# Patient Record
Sex: Male | Born: 1990 | Race: White | Hispanic: No | Marital: Single | Smoking: Current every day smoker
Health system: Southern US, Community
[De-identification: ages and names within clinical notes are randomized; demographics above are authoritative.]

## PROBLEM LIST (undated history)

## (undated) ENCOUNTER — Emergency Department (HOSPITAL_COMMUNITY): Admission: EM | Payer: Medicaid Other

## (undated) HISTORY — PX: OTHER SURGICAL HISTORY: SHX169

## (undated) HISTORY — PX: LEG SURGERY: SHX1003

---
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
## 2010-11-24 LAB — CBC
HCT: 27 % — ABNORMAL LOW (ref 39.0–52.0)
HCT: 39.9 % (ref 39.0–52.0)
HCT: 40.7 % (ref 39.0–52.0)
Hemoglobin: 13.4 g/dL (ref 13.0–17.0)
Hemoglobin: 13.9 g/dL (ref 13.0–17.0)
Hemoglobin: 9.4 g/dL — ABNORMAL LOW (ref 13.0–17.0)
MCHC: 34.1 g/dL (ref 30.0–36.0)
MCHC: 34.1 g/dL (ref 30.0–36.0)
MCV: 100 fL (ref 78.0–100.0)
MCV: 100.3 fL — ABNORMAL HIGH (ref 78.0–100.0)
Platelets: 204 10*3/uL (ref 150–400)
Platelets: 216 10*3/uL (ref 150–400)
Platelets: 244 10*3/uL (ref 150–400)
RBC: 2.73 MIL/uL — ABNORMAL LOW (ref 4.22–5.81)
RBC: 4.01 MIL/uL — ABNORMAL LOW (ref 4.22–5.81)
RDW: 13.1 % (ref 11.5–15.5)
RDW: 13.2 % (ref 11.5–15.5)
RDW: 13.3 % (ref 11.5–15.5)
WBC: 5.4 10*3/uL (ref 4.0–10.5)

## 2010-11-24 LAB — BASIC METABOLIC PANEL
BUN: 10 mg/dL (ref 6–23)
BUN: 11 mg/dL (ref 6–23)
BUN: 14 mg/dL (ref 6–23)
CO2: 28 mEq/L (ref 19–32)
Calcium: 8.9 mg/dL (ref 8.4–10.5)
Calcium: 9.1 mg/dL (ref 8.4–10.5)
Creatinine, Ser: 1.23 mg/dL (ref 0.4–1.5)
GFR calc non Af Amer: >60 mL/min (ref >60)
GFR calc non Af Amer: >60 mL/min (ref >60)
GFR calc non Af Amer: >60 mL/min (ref >60)
Glucose, Bld: 90 mg/dL (ref 70–99)
Glucose, Bld: 98 mg/dL (ref 70–99)
Potassium: 4.5 mEq/L (ref 3.5–5.1)
Potassium: 5.1 mEq/L (ref 3.5–5.1)
Sodium: 135 mEq/L (ref 135–145)
Sodium: 138 mEq/L (ref 135–145)

## 2010-11-24 LAB — COMPREHENSIVE METABOLIC PANEL
AST: 21 U/L (ref 0–37)
CO2: 28 mEq/L (ref 19–32)
Calcium: 9 mg/dL (ref 8.4–10.5)
Creatinine, Ser: 1.29 mg/dL (ref 0.4–1.5)
GFR calc Af Amer: >60 mL/min (ref >60)
GFR calc non Af Amer: >60 mL/min (ref >60)

## 2010-11-24 LAB — CULTURE, ROUTINE-ABSCESS

## 2010-11-24 LAB — ANAEROBIC CULTURE

## 2010-11-24 LAB — PROTIME-INR
INR: 1.1 (ref 0.00–1.49)
Prothrombin Time: 13.9 seconds (ref 11.6–15.2)

## 2010-11-24 LAB — C-REACTIVE PROTEIN: CRP: 2.7 mg/dL — ABNORMAL HIGH (ref <0.6)

## 2010-12-31 NOTE — Op Note (Signed)
NAMELAURENT, CARGILE          ACCOUNT NO.:  [id number]   MEDICAL RECORD NO.:  [id number]          PATIENT TYPE:  INP   LOCATION:  5033                         FACILITY:  [facility]   PHYSICIAN:  Feliberto Gottron. Turner Daniels, M.D.   DATE OF BIRTH:  Aug 07, 1991   DATE OF PROCEDURE:  03/27/2009  DATE OF DISCHARGE:                               OPERATIVE REPORT   PREOPERATIVE DIAGNOSIS:  Right forearm abscess.   POSTOPERATIVE DIAGNOSIS:  Right forearm abscess.   PROCEDURE:  Incision and drainage, right forearm abscess, subcutaneous.   SURGEON:  Feliberto Gottron. Turner Daniels, MD   FIRST ASSISTANT:  None.   ANESTHETIC:  General, LMA.   ESTIMATED BLOOD LOSS:  Minimal.   FLUID REPLACEMENT:  500 mL of crystalloid.   DRAINS PLACED:  Iodoform wick.   SPECIMENS TO LAB:  Culture and gram stain of the abscess fluid.   INDICATIONS FOR PROCEDURE:  An 20 year old young man presented to our  office yesterday 4 days after being bitten by a spider he thinks, having  failed treatment with oral doxycycline and Septra.  We admitted him to  the hospital because of rapidly progressing erythema and swelling going  down his arm.  An MRI scan showed about a 2-cm abscess overnight, on  Zosyn and vancomycin the erythema diminish greatly, but he still had a  painful abscess on his forearm.  He is taken for irrigation and  debridement of abscess to decrease pain and increase function.  We got a  good specimen of the organism that we are dealing with.  The risks and  benefits of surgery have been discussed.  Questions are answered.   DESCRIPTION OF PROCEDURE:  The patient was identified by armband and  taken to the operating room #10.  Appropriate anesthetic monitors were  attached and general LMA anesthesia was induced with the patient in the  supine position.  Right upper extremity was prepped and draped in the  usual sterile fashion from the wrist to just above the elbow.  A time-  out procedure was performed.  We began the  operation itself by making a  0.5-cm incision over the abscess cutting into the abscess cavity and  getting back green purulent material about 3-5 mL.  Gram stain and  cultures were sent.  The abscess cavity was then thoroughly irrigated  with normal saline alternating with  hydrogen peroxide.  The cavity itself was about 2 cm x 2 cm x 1 cm and  relatively easy to clean.  We then placed a 0.25-inch iodoform wick,  placed 2 simple sutures on either end of the wound and a dressing of 4 x  4's and an Ace wrap.  At this point, the patient was awakened and taken  to the recovery room without difficulty.      Feliberto Gottron. Turner Daniels, M.D.  Electronically Signed     FJR/MEDQ  D:  03/28/2009  T:  03/28/2009  Job:  [id number]

## 2011-01-03 NOTE — Discharge Summary (Signed)
NAMENASSER, KU          ACCOUNT NO.:  [id number]   MEDICAL RECORD NO.:  [id number]          PATIENT TYPE:  INP   LOCATION:  5033                         FACILITY:  [facility]   PHYSICIAN:  Feliberto Gottron. Turner Daniels, M.D.   DATE OF BIRTH:  1990/10/27   DATE OF ADMISSION:  03/27/2009  DATE OF DISCHARGE:  03/31/2009                               DISCHARGE SUMMARY   CHIEF COMPLAINT:  Right elbow pain.   HISTORY OF PRESENT ILLNESS:  This is an 20 year old gentleman who  presented to the clinic on March 27, 2009 after a possible spider bite.  He was seen at Urgent Care and was treated with doxycycline and Septra.  He came in complaining of rapidly progressing erythema and swelling in  his elbow and forearm and so he was admitted to the hospital for IV  antibiotic therapy.  Infectious Disease was consulted and the patient  was placed on vancomycin and Zosyn.  The following morning, he reported  slight improvement in his pain.  The erythema and swelling in his elbow  appeared to be improving.  An MRI scan demonstrated evidence of an  abscess and he was taken to the operating room for incision and drainage  that afternoon.  On the first postoperative day, the patient was awake  and alert.  He complained of pain in his elbow that was fairly well  controlled with oral medications.  He was afebrile.  His surgical  dressing was changed and his incision was benign.  On the second  postoperative day, the patient was doing well and reported significant  improvement in his elbow pain.  The sensitivities came back and revealed  MRSA, so a PICC line was placed.  The patient was discharged home the  following day on IV vancomycin therapy.   DISPOSITION:  The patient was discharged home on March 31, 2009.  Genevieve Norlander managed his home health antibiotic therapy.   DISCHARGE MEDICATIONS:  As per the HMR with the addition of vancomycin  IV per pharmacy protocol and Percocet 5 mg 1-2 tablets p.o. q.4 h.  p.r.n.  pain.   FOLLOWUP:  Mr. Beehler will return to the clinic to see Dr. Turner Daniels in 1  week for reevaluation.   FINAL DIAGNOSIS:  Right elbow infection.      Shirl Harris, PA      Feliberto Gottron. Turner Daniels, M.D.  Electronically Signed    JW/MEDQ  D:  04/16/2009  T:  04/17/2009  Job:  [id number]

## 2012-12-05 ENCOUNTER — Encounter (HOSPITAL_COMMUNITY): Payer: Self-pay | Admitting: Emergency Medicine

## 2012-12-05 ENCOUNTER — Emergency Department (HOSPITAL_COMMUNITY): Payer: No Typology Code available for payment source

## 2012-12-05 ENCOUNTER — Emergency Department (HOSPITAL_COMMUNITY)
Admission: EM | Admit: 2012-12-05 | Discharge: 2012-12-05 | Disposition: A | Discharge status: Home Health | Payer: No Typology Code available for payment source | Attending: Emergency Medicine | Admitting: Emergency Medicine

## 2012-12-05 DIAGNOSIS — IMO0002 Reserved for concepts with insufficient information to code with codable children: Secondary | ICD-10-CM | POA: Insufficient documentation

## 2012-12-05 DIAGNOSIS — T07XXXA Unspecified multiple injuries, initial encounter: Secondary | ICD-10-CM

## 2012-12-05 DIAGNOSIS — S060X1A Concussion with loss of consciousness of 30 minutes or less, initial encounter: Secondary | ICD-10-CM | POA: Insufficient documentation

## 2012-12-05 DIAGNOSIS — Y9389 Activity, other specified: Secondary | ICD-10-CM | POA: Insufficient documentation

## 2012-12-05 DIAGNOSIS — S43006A Unspecified dislocation of unspecified shoulder joint, initial encounter: Secondary | ICD-10-CM | POA: Insufficient documentation

## 2012-12-05 DIAGNOSIS — R404 Transient alteration of awareness: Secondary | ICD-10-CM | POA: Insufficient documentation

## 2012-12-05 DIAGNOSIS — Y92410 Unspecified street and highway as the place of occurrence of the external cause: Secondary | ICD-10-CM | POA: Insufficient documentation

## 2012-12-05 DIAGNOSIS — S43004A Unspecified dislocation of right shoulder joint, initial encounter: Secondary | ICD-10-CM

## 2012-12-05 DIAGNOSIS — R21 Rash and other nonspecific skin eruption: Secondary | ICD-10-CM | POA: Insufficient documentation

## 2012-12-05 MED ORDER — OXYCODONE-ACETAMINOPHEN 5-325 MG PO TABS
2.0000 | ORAL_TABLET | Freq: Once | ORAL | Status: AC
  Administered 2012-12-05: 2 via ORAL
  Filled 2012-12-05: qty 2

## 2012-12-05 MED ORDER — ONDANSETRON HCL 4 MG/2ML IJ SOLN
INTRAMUSCULAR | Status: AC
  Filled 2012-12-05: qty 2

## 2012-12-05 MED ORDER — OXYCODONE-ACETAMINOPHEN 5-325 MG PO TABS: 2.0000 | ORAL_TABLET | Freq: Four times a day (QID) | ORAL | Status: DC | PRN

## 2012-12-05 MED ORDER — HYDROMORPHONE HCL PF 1 MG/ML IJ SOLN
1.0000 mg | Freq: Once | INTRAMUSCULAR | Status: AC
  Administered 2012-12-05: 1 mg via INTRAVENOUS
  Filled 2012-12-05: qty 1

## 2012-12-05 MED ORDER — ONDANSETRON HCL 4 MG/2ML IJ SOLN
4.0000 mg | Freq: Once | INTRAMUSCULAR | Status: AC
  Administered 2012-12-05: 4 mg via INTRAVENOUS

## 2012-12-05 NOTE — ED Provider Notes (Signed)
Patient alert and ambulatory, Glasgow Coma Score 15 upon discharge I have personally seen and examined the patient.  I have discussed the plan of care with the resident.  I have reviewed the documentation on PMH/FH/Soc. History.  I have reviewed the documentation of the resident and agree.  Doug Sou, MD 12/05/12 [phone]
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
## 2012-12-05 NOTE — ED Notes (Signed)
Pt removed arm sling after it was put on. Pt does not want the sling on
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
## 2012-12-05 NOTE — ED Notes (Signed)
Patient discharged to home with family. See previous note concerning patient behavior during discharge. Patient refused to sign and walked out with paper work and prescription.

## 2012-12-05 NOTE — ED Notes (Signed)
Pt cursing staff in ED and in CT. Removed collar after staff told him not to take it off.

## 2012-12-05 NOTE — ED Notes (Signed)
When discharging patient, patient cursing loudly at staff, calling staff "God Damn fucking cock suckers". Patient walked out of ED during discharge process with paper work and prescription for percocet.
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
## 2012-12-08 ENCOUNTER — Emergency Department: Payer: Self-pay | Admitting: Emergency Medicine

## 2013-09-01 ENCOUNTER — Encounter (HOSPITAL_COMMUNITY): Payer: Self-pay | Admitting: Emergency Medicine

## 2013-09-01 ENCOUNTER — Emergency Department (HOSPITAL_COMMUNITY)
Admission: EM | Admit: 2013-09-01 | Discharge: 2013-09-01 | Disposition: A | Discharge status: Home / Self Care | Payer: Self-pay | Attending: Emergency Medicine | Admitting: Emergency Medicine

## 2013-09-01 DIAGNOSIS — L0291 Cutaneous abscess, unspecified: Secondary | ICD-10-CM

## 2013-09-01 DIAGNOSIS — L02519 Cutaneous abscess of unspecified hand: Secondary | ICD-10-CM | POA: Insufficient documentation

## 2013-09-01 DIAGNOSIS — Z8614 Personal history of Methicillin resistant Staphylococcus aureus infection: Secondary | ICD-10-CM | POA: Insufficient documentation

## 2013-09-01 DIAGNOSIS — L03119 Cellulitis of unspecified part of limb: Principal | ICD-10-CM

## 2013-09-01 DIAGNOSIS — L0390 Cellulitis, unspecified: Secondary | ICD-10-CM

## 2013-09-01 LAB — CBC WITH DIFFERENTIAL/PLATELET
Basophils Absolute: 0 10*3/uL (ref 0.0–0.1)
Basophils Relative: 0 % (ref 0–1)
Eosinophils Absolute: 0.1 10*3/uL (ref 0.0–0.7)
Eosinophils Relative: 1 % (ref 0–5)
HCT: 37.7 % — ABNORMAL LOW (ref 39.0–52.0)
HEMOGLOBIN: 13.4 g/dL (ref 13.0–17.0)
LYMPHS ABS: 2.1 10*3/uL (ref 0.7–4.0)
LYMPHS PCT: 18 % (ref 12–46)
MCH: 33.3 pg (ref 26.0–34.0)
MCHC: 35.5 g/dL (ref 30.0–36.0)
MCV: 93.8 fL (ref 78.0–100.0)
Monocytes Absolute: 1.1 10*3/uL — ABNORMAL HIGH (ref 0.1–1.0)
Monocytes Relative: 10 % (ref 3–12)
NEUTROS ABS: 7.9 10*3/uL — AB (ref 1.7–7.7)
NEUTROS PCT: 71 % (ref 43–77)
PLATELETS: 288 10*3/uL (ref 150–400)
RBC: 4.02 MIL/uL — AB (ref 4.22–5.81)
RDW: 12.3 % (ref 11.5–15.5)
WBC: 11.2 10*3/uL — AB (ref 4.0–10.5)

## 2013-09-01 LAB — BASIC METABOLIC PANEL
BUN: 17 mg/dL (ref 6–23)
CHLORIDE: 99 meq/L (ref 96–112)
CO2: 25 meq/L (ref 19–32)
Calcium: 9.3 mg/dL (ref 8.4–10.5)
Creatinine, Ser: 0.93 mg/dL (ref 0.50–1.35)
GFR calc Af Amer: >90 mL/min (ref >90)
GLUCOSE: 106 mg/dL — AB (ref 70–99)
POTASSIUM: 3.7 meq/L (ref 3.7–5.3)
SODIUM: 139 meq/L (ref 137–147)

## 2013-09-01 MED ORDER — IBUPROFEN 600 MG PO TABS: 600.0000 mg | ORAL_TABLET | Freq: Four times a day (QID) | ORAL | Status: DC | PRN

## 2013-09-01 MED ORDER — CLINDAMYCIN HCL 300 MG PO CAPS: 300.0000 mg | ORAL_CAPSULE | Freq: Four times a day (QID) | ORAL | Status: DC

## 2013-09-01 MED ORDER — OXYCODONE-ACETAMINOPHEN 5-325 MG PO TABS: 1.0000 | ORAL_TABLET | ORAL | Status: DC | PRN

## 2013-09-01 MED ORDER — VANCOMYCIN HCL IN DEXTROSE 1-5 GM/200ML-% IV SOLN
1000.0000 mg | Freq: Once | INTRAVENOUS | Status: AC
  Administered 2013-09-01: 1000 mg via INTRAVENOUS
  Filled 2013-09-01: qty 200

## 2013-09-01 MED ORDER — SODIUM CHLORIDE 0.9 % IV BOLUS (SEPSIS)
1000.0000 mL | Freq: Once | INTRAVENOUS | Status: AC
Start: 2013-09-01 — End: 2013-09-01
  Administered 2013-09-01: 1000 mL via INTRAVENOUS

## 2013-09-01 MED ORDER — MORPHINE SULFATE 4 MG/ML IJ SOLN
4.0000 mg | Freq: Once | INTRAMUSCULAR | Status: AC
  Administered 2013-09-01: 4 mg via INTRAVENOUS
  Filled 2013-09-01: qty 1

## 2013-09-01 MED ORDER — ONDANSETRON HCL 4 MG/2ML IJ SOLN
4.0000 mg | Freq: Once | INTRAMUSCULAR | Status: AC
  Administered 2013-09-01: 4 mg via INTRAVENOUS
  Filled 2013-09-01: qty 2

## 2013-09-01 NOTE — ED Notes (Signed)
Pt in c/o abscess to top of left wrist x3-4 days, redness noted

## 2013-09-01 NOTE — Progress Notes (Signed)
[facility] received call from Pharmacist [facility] [address] in regards to medication assistance. [facility] to right wrist with drainage. Pt was discharge home with prescription for Clindamycin and pain meds. Pt apparently does not have insurance and states, he cannot afford the cost of the antibiotic. Pt was eligible for MATCH. MATCH letter was printed and faxed to pharmacy at [phone]. Faxed confirmation received. Asked Pharmacist explained this will not cover his pain meds and there is a $3 co-pay. Pt voiced understanding as per Pharmacist. No further ED CM needs identified.

## 2013-09-01 NOTE — Discharge Instructions (Signed)
Return to the emergency department in one day to have your wrist reevaluated to assure continued healing. Return immediately for worsening reddening, fever, chills, vomiting or for any concerns.   Cellulitis Cellulitis is an infection of the skin and the tissue beneath it. The infected area is usually red and tender. Cellulitis occurs most often in the arms and lower legs.  CAUSES  Cellulitis is caused by bacteria that enter the skin through cracks or cuts in the skin. The most common types of bacteria that cause cellulitis are Staphylococcus and Streptococcus. SYMPTOMS   Redness and warmth.  Swelling.  Tenderness or pain.  Fever. DIAGNOSIS  Your caregiver can usually determine what is wrong based on a physical exam. Blood tests may also be done. TREATMENT  Treatment usually involves taking an antibiotic medicine. HOME CARE INSTRUCTIONS   Take your antibiotics as directed. Finish them even if you start to feel better.  Keep the infected arm or leg elevated to reduce swelling.  Apply a warm cloth to the affected area up to 4 times per day to relieve pain.  Only take over-the-counter or prescription medicines for pain, discomfort, or fever as directed by your caregiver.  Keep all follow-up appointments as directed by your caregiver. SEEK MEDICAL CARE IF:   You notice red streaks coming from the infected area.  Your red area gets larger or turns dark in color.  Your bone or joint underneath the infected area becomes painful after the skin has healed.  Your infection returns in the same area or another area.  You notice a swollen bump in the infected area.  You develop new symptoms. SEEK IMMEDIATE MEDICAL CARE IF:   You have a fever.  You feel very sleepy.  You develop vomiting or diarrhea.  You have a general ill feeling (malaise) with muscle aches and pains. MAKE SURE YOU:   Understand these instructions.  Will watch your condition.  Will get help right away  if you are not doing well or get worse. Document Released: 05/14/2005 Document Revised: 02/03/2012 Document Reviewed: 10/20/2011 [facility] Patient Information 2014 [employer], [place].

## 2013-09-01 NOTE — ED Provider Notes (Signed)
CSN: [id number]     Arrival date & time 09/01/13  0110 History   First MD Initiated Contact with Patient 09/01/13 [phone]     Chief Complaint  Patient presents with  . Abscess   (Consider location/radiation/quality/duration/timing/severity/associated sxs/prior Treatment) HPI Patient presents with 4 days of abscess to the dorsum of his right wrist. He states it was itching and he scratched at it. The last one to 2 days he's had increased pain and spreading redness to the surrounding area. He denies any fever chills. He denies any axillary lymphadenopathy. Patient has had no periods of drainage from the lesion on his wrists. He does have a history of previous MRSA infection requiring IV antibiotic treatment. History reviewed. No pertinent past medical history. History reviewed. No pertinent past surgical history. History reviewed. No pertinent family history. History  Substance Use Topics  . Smoking status: Not on file  . Smokeless tobacco: Not on file  . Alcohol Use: Not on file    Review of Systems  Constitutional: Negative for fever, chills and fatigue.  Gastrointestinal: Negative for nausea and vomiting.  Musculoskeletal: Negative for arthralgias.  Skin: Positive for color change, rash and wound.  Neurological: Negative for dizziness, weakness and numbness.  All other systems reviewed and are negative.    Allergies  Review of patient's allergies indicates no known allergies.  Home Medications   Current Outpatient Rx  Name  Route  Sig  Dispense  Refill  . oxyCODONE-acetaminophen (PERCOCET/ROXICET) 5-325 MG per tablet   Oral   Take 1 tablet by mouth once.          BP 136/84  Pulse 105  Temp(Src) 98.7 F (37.1 C) (Oral)  Resp 18  SpO2 98% Physical Exam  Nursing note and vitals reviewed. Constitutional: He is oriented to person, place, and time. He appears well-developed and well-nourished. No distress.  HENT:  Head: Normocephalic and atraumatic.  Eyes: EOM are  normal. Pupils are equal, round, and reactive to light.  Neck: Normal range of motion. Neck supple.  Cardiovascular: Normal rate and regular rhythm.   Pulmonary/Chest: Effort normal and breath sounds normal. No respiratory distress. He has no wheezes. He has no rales.  Abdominal: Soft. Bowel sounds are normal. He exhibits no distension and no mass. There is no tenderness. There is no rebound and no guarding.  Musculoskeletal: Normal range of motion. He exhibits tenderness. He exhibits no edema.  Patient with a abscess to the dorsum of the right wrist roughly 2 cm in diameter. There is a punctate lesion in the center. It is non-draining. There is minimal fluctuance. Patient has surrounding erythema to the dorsum of the back and extending up to the midforearm. This is tender to palpation. There is no crepitance.  Neurological: He is alert and oriented to person, place, and time.  Motor and sensory intact.  Skin: Skin is warm and dry. No rash noted. There is erythema.  Psychiatric: He has a normal mood and affect. His behavior is normal.    ED Course  INCISION AND DRAINAGE Date/Time: 09/01/2013 4:08 AM Performed by: Loren RacerYELVERTON, Acel Natzke Authorized by: Ranae PalmsYELVERTON, Darius Fillingim Consent: Verbal consent obtained. Time out: Immediately prior to procedure a "time out" was called to verify the correct patient, procedure, equipment, support staff and site/side marked as required. Type: abscess Body area: upper extremity Location details: right wrist Anesthesia: local infiltration Local anesthetic: lidocaine 1% without epinephrine Anesthetic total: 3 ml Patient sedated: no Risk factor: underlying major vessel and underlying major nerve Scalpel size:  11 Needle gauge: 20 Incision type: single straight Complexity: complex Drainage: purulent Drainage amount: moderate Wound treatment: wound left open Packing material: none Patient tolerance: Patient tolerated the procedure well with no immediate  complications.   (including critical care time) Labs Review Labs Reviewed  CBC WITH DIFFERENTIAL  BASIC METABOLIC PANEL   Imaging Review No results found.  EKG Interpretation   None       MDM  History with abscess complicated by cellulitis to the dorsum of the right forearm. We'll give the dose of IV antibiotics and reassess.  Observed in the emergency department for 4 hours. The site of cellulitis has been marked. He has decreasing erythema and appears improvement of the cellulitis. Will discharge the patient home with clindamycin and is advised to return to the emergency department in 24 hours to have the wound rechecked. At that time he may need another dose of IV vancomycin or if worsening may need hospital admission. Patient is aware that he is to return immediately for any worsening of symptoms, fever, chills, vomiting or any concerns.  Loren Racer, MD 09/01/13 [phone]

## 2015-01-01 ENCOUNTER — Emergency Department (HOSPITAL_COMMUNITY): Payer: Self-pay

## 2015-01-01 ENCOUNTER — Emergency Department (HOSPITAL_COMMUNITY)
Admission: EM | Admit: 2015-01-01 | Discharge: 2015-01-02 | Disposition: A | Discharge status: Home / Self Care | Payer: Self-pay | Attending: Emergency Medicine | Admitting: Emergency Medicine

## 2015-01-01 ENCOUNTER — Encounter (HOSPITAL_COMMUNITY): Payer: Self-pay | Admitting: Emergency Medicine

## 2015-01-01 DIAGNOSIS — Y92410 Unspecified street and highway as the place of occurrence of the external cause: Secondary | ICD-10-CM | POA: Insufficient documentation

## 2015-01-01 DIAGNOSIS — M542 Cervicalgia: Secondary | ICD-10-CM

## 2015-01-01 DIAGNOSIS — Y9389 Activity, other specified: Secondary | ICD-10-CM | POA: Insufficient documentation

## 2015-01-01 DIAGNOSIS — S199XXA Unspecified injury of neck, initial encounter: Secondary | ICD-10-CM | POA: Insufficient documentation

## 2015-01-01 DIAGNOSIS — Y998 Other external cause status: Secondary | ICD-10-CM | POA: Insufficient documentation

## 2015-01-01 DIAGNOSIS — S3992XA Unspecified injury of lower back, initial encounter: Secondary | ICD-10-CM | POA: Insufficient documentation

## 2015-01-01 DIAGNOSIS — Z720 Tobacco use: Secondary | ICD-10-CM | POA: Insufficient documentation

## 2015-01-01 DIAGNOSIS — M549 Dorsalgia, unspecified: Secondary | ICD-10-CM

## 2015-01-01 MED ORDER — OXYCODONE-ACETAMINOPHEN 5-325 MG PO TABS
2.0000 | ORAL_TABLET | Freq: Once | ORAL | Status: AC
  Administered 2015-01-01: 2 via ORAL
  Filled 2015-01-01: qty 2
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
## 2015-01-02 MED ORDER — OXYCODONE-ACETAMINOPHEN 5-325 MG PO TABS: 1.0000 | ORAL_TABLET | ORAL | Status: DC | PRN

## 2015-01-02 MED ORDER — METHOCARBAMOL 500 MG PO TABS: 500.0000 mg | ORAL_TABLET | Freq: Two times a day (BID) | ORAL | Status: DC
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
## 2015-04-15 ENCOUNTER — Emergency Department (HOSPITAL_COMMUNITY)
Admission: EM | Admit: 2015-04-15 | Discharge: 2015-04-15 | Disposition: A | Discharge status: Home / Self Care | Payer: Worker's Compensation | Attending: Emergency Medicine | Admitting: Emergency Medicine

## 2015-04-15 ENCOUNTER — Emergency Department (HOSPITAL_COMMUNITY): Payer: Worker's Compensation

## 2015-04-15 ENCOUNTER — Encounter (HOSPITAL_COMMUNITY): Payer: Self-pay

## 2015-04-15 DIAGNOSIS — S99921A Unspecified injury of right foot, initial encounter: Secondary | ICD-10-CM | POA: Diagnosis present

## 2015-04-15 DIAGNOSIS — Z79899 Other long term (current) drug therapy: Secondary | ICD-10-CM | POA: Diagnosis not present

## 2015-04-15 DIAGNOSIS — F419 Anxiety disorder, unspecified: Secondary | ICD-10-CM | POA: Diagnosis not present

## 2015-04-15 DIAGNOSIS — Z720 Tobacco use: Secondary | ICD-10-CM | POA: Diagnosis not present

## 2015-04-15 DIAGNOSIS — Y9289 Other specified places as the place of occurrence of the external cause: Secondary | ICD-10-CM | POA: Diagnosis not present

## 2015-04-15 DIAGNOSIS — S92424B Nondisplaced fracture of distal phalanx of right great toe, initial encounter for open fracture: Secondary | ICD-10-CM | POA: Diagnosis not present

## 2015-04-15 DIAGNOSIS — Y998 Other external cause status: Secondary | ICD-10-CM | POA: Diagnosis not present

## 2015-04-15 DIAGNOSIS — Y9389 Activity, other specified: Secondary | ICD-10-CM | POA: Insufficient documentation

## 2015-04-15 DIAGNOSIS — S91211A Laceration without foreign body of right great toe with damage to nail, initial encounter: Secondary | ICD-10-CM | POA: Diagnosis not present

## 2015-04-15 DIAGNOSIS — W208XXA Other cause of strike by thrown, projected or falling object, initial encounter: Secondary | ICD-10-CM | POA: Diagnosis not present

## 2015-04-15 DIAGNOSIS — Z23 Encounter for immunization: Secondary | ICD-10-CM | POA: Insufficient documentation

## 2015-04-15 DIAGNOSIS — S6981XA Other specified injuries of right wrist, hand and finger(s), initial encounter: Secondary | ICD-10-CM

## 2015-04-15 MED ORDER — HYDROMORPHONE HCL 1 MG/ML IJ SOLN
1.0000 mg | Freq: Once | INTRAMUSCULAR | Status: AC
  Administered 2015-04-15: 1 mg via INTRAVENOUS
  Filled 2015-04-15: qty 1

## 2015-04-15 MED ORDER — NAPROXEN 500 MG PO TABS: 500.0000 mg | ORAL_TABLET | Freq: Two times a day (BID) | ORAL | Status: DC

## 2015-04-15 MED ORDER — BACITRACIN ZINC 500 UNIT/GM EX OINT
1.0000 "application " | TOPICAL_OINTMENT | Freq: Two times a day (BID) | CUTANEOUS | Status: DC
  Administered 2015-04-15: 1 via TOPICAL

## 2015-04-15 MED ORDER — KETOROLAC TROMETHAMINE 30 MG/ML IJ SOLN
30.0000 mg | Freq: Once | INTRAMUSCULAR | Status: AC
  Administered 2015-04-15: 30 mg via INTRAVENOUS
  Filled 2015-04-15: qty 1

## 2015-04-15 MED ORDER — ONDANSETRON HCL 4 MG/2ML IJ SOLN
4.0000 mg | INTRAMUSCULAR | Status: AC
  Administered 2015-04-15: 4 mg via INTRAVENOUS
  Filled 2015-04-15: qty 2

## 2015-04-15 MED ORDER — OXYCODONE-ACETAMINOPHEN 5-325 MG PO TABS: 1.0000 | ORAL_TABLET | Freq: Four times a day (QID) | ORAL | Status: DC | PRN

## 2015-04-15 MED ORDER — CEFAZOLIN SODIUM-DEXTROSE 2-3 GM-% IV SOLR
2.0000 g | Freq: Once | INTRAVENOUS | Status: AC
  Administered 2015-04-15: 2 g via INTRAVENOUS
  Filled 2015-04-15: qty 50

## 2015-04-15 MED ORDER — CEPHALEXIN 500 MG PO CAPS: 500.0000 mg | ORAL_CAPSULE | Freq: Four times a day (QID) | ORAL | Status: DC

## 2015-04-15 MED ORDER — TETANUS-DIPHTH-ACELL PERTUSSIS 5-2.5-18.5 LF-MCG/0.5 IM SUSP
0.5000 mL | Freq: Once | INTRAMUSCULAR | Status: AC
  Administered 2015-04-15: 0.5 mL via INTRAMUSCULAR
  Filled 2015-04-15: qty 0.5

## 2015-04-15 MED ORDER — OXYCODONE-ACETAMINOPHEN 5-325 MG PO TABS
1.0000 | ORAL_TABLET | Freq: Once | ORAL | Status: AC
  Administered 2015-04-15: 1 via ORAL
  Filled 2015-04-15: qty 1
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
## 2015-04-15 NOTE — ED Notes (Signed)
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 Pt right great toenail almost gone and bleeding at toenail bed. Pt given 250 fentanyl and 6 morphine, pain decreased to 6/10. BP 140/86, 98% RA, 104bpm.

## 2015-04-15 NOTE — Discharge Instructions (Signed)
Take Keflex to cover for infection and Naproxen for pain. You may take Percocet for severe pain. Change your dressing once per day to keep the area clean. Wear a post op shoe for comfort and to promote healing. Use crutches as needed when walking. Follow up with an orthopedic doctor for wound recheck.  Fingernail or Toenail Loss All or part of your fingernail or toenail has been lost. This may or may not grow back as a normal nail. A special non-stick bandage has been put on your finger or toe tightly to prevent bleeding. HOME CARE INSTRUCTIONS  The tips of fingers and toes are full of nerves and injuries are often very painful. The following will help you decrease the pain and obtain the best outcome.  Keep your hand or foot elevated above your heart to relieve pain and swelling. This will require lying in bed or on a couch with the hand or leg on pillows or sitting in a recliner with the leg up. Letting your hand or leg dangle may increase swelling, slow healing and cause throbbing pain.  Keep your dressing dry and clean.  Change your bandage in 24 hours after going home.  After your bandage is changed, soak your hand or foot in warm soapy water for 10 to 20 minutes. Do this 3 times per day. This helps reduce pain and swelling. After soaking, apply a clean, dry bandage. Change your bandage if it is wet or dirty.  Only take over-the-counter or prescription medicines for pain, discomfort, or fever as directed by your caregiver.  See your caregiver as needed for problems. SEEK IMMEDIATE MEDICAL CARE IF:   You have increased pain, swelling, drainage, or bleeding.  You have a fever. MAKE SURE YOU:   Understand these instructions.  Will watch your condition.  Will get help right away if you are not doing well or get worse. Document Released: 06/26/2006 Document Revised: 10/27/2011 Document Reviewed: 09/15/2006 [facility] Patient Information 2015 [place], [place]. This information is not  intended to replace advice given to you by your health care provider. Make sure you discuss any questions you have with your health care provider.  Toe Fracture Your caregiver has diagnosed you as having a fractured toe. A toe fracture is a break in the bone of a toe. "Buddy taping" is a way of splinting your broken toe, by taping the broken toe to the toe next to it. This "buddy taping" will keep the injured toe from moving beyond normal range of motion. Buddy taping also helps the toe heal in a more normal alignment. It may take 6 to 8 weeks for the toe injury to heal. HOME CARE INSTRUCTIONS   Leave your toes taped together for as long as directed by your caregiver or until you see a doctor for a follow-up examination. You can change the tape after bathing. Always use a small piece of gauze or cotton between the toes when taping them together. This will help the skin stay dry and prevent infection.  Apply ice to the injury for 15-20 minutes each hour while awake for the first 2 days. Put the ice in a plastic bag and place a towel between the bag of ice and your skin.  After the first 2 days, apply heat to the injured area. Use heat for the next 2 to 3 days. Place a heating pad on the foot or soak the foot in warm water as directed by your caregiver.  Keep your foot elevated as much  as possible to lessen swelling.  Wear sturdy, supportive shoes. The shoes should not pinch the toes or fit tightly against the toes.  Your caregiver may prescribe a rigid shoe if your foot is very swollen.  Your may be given crutches if the pain is too great and it hurts too much to walk.  Only take over-the-counter or prescription medicines for pain, discomfort, or fever as directed by your caregiver.  If your caregiver has given you a follow-up appointment, it is very important to keep that appointment. Not keeping the appointment could result in a chronic or permanent injury, pain, and disability. If there is  any problem keeping the appointment, you must call back to this facility for assistance. SEEK MEDICAL CARE IF:   You have increased pain or swelling, not relieved with medications.  The pain does not get better after 1 week.  Your injured toe is cold when the others are warm. SEEK IMMEDIATE MEDICAL CARE IF:   The toe becomes cold, numb, or white.  The toe becomes hot (inflamed) and red. Document Released: 08/01/2000 Document Revised: 10/27/2011 Document Reviewed: 03/20/2008 [facility] Patient Information 2015 [place], [place]. This information is not intended to replace advice given to you by your health care provider. Make sure you discuss any questions you have with your health care provider.  RICE: Routine Care for Injuries The routine care of many injuries includes Rest, Ice, Compression, and Elevation (RICE). HOME CARE INSTRUCTIONS  Rest is needed to allow your body to heal. Routine activities can usually be resumed when comfortable. Injured tendons and bones can take up to 6 weeks to heal. Tendons are the cord-like structures that attach muscle to bone.  Ice following an injury helps keep the swelling down and reduces pain.  Put ice in a plastic bag.  Place a towel between your skin and the bag.  Leave the ice on for 15-20 minutes, 3-4 times a day, or as directed by your health care provider. Do this while awake, for the first 24 to 48 hours. After that, continue as directed by your caregiver.  Compression helps keep swelling down. It also gives support and helps with discomfort. If an elastic bandage has been applied, it should be removed and reapplied every 3 to 4 hours. It should not be applied tightly, but firmly enough to keep swelling down. Watch fingers or toes for swelling, bluish discoloration, coldness, numbness, or excessive pain. If any of these problems occur, remove the bandage and reapply loosely. Contact your caregiver if these problems continue.  Elevation helps  reduce swelling and decreases pain. With extremities, such as the arms, hands, legs, and feet, the injured area should be placed near or above the level of the heart, if possible. SEEK IMMEDIATE MEDICAL CARE IF:  You have persistent pain and swelling.  You develop redness, numbness, or unexpected weakness.  Your symptoms are getting worse rather than improving after several days. These symptoms may indicate that further evaluation or further X-rays are needed. Sometimes, X-rays may not show a small broken bone (fracture) until 1 week or 10 days later. Make a follow-up appointment with your caregiver. Ask when your X-ray results will be ready. Make sure you get your X-ray results. Document Released: 11/16/2000 Document Revised: 08/09/2013 Document Reviewed: 01/03/2011 [facility] Patient Information 2015 [place], [place]. This information is not intended to replace advice given to you by your health care provider. Make sure you discuss any questions you have with your health care provider.

## 2016-01-06 ENCOUNTER — Encounter (HOSPITAL_COMMUNITY): Payer: Self-pay | Admitting: Emergency Medicine

## 2016-01-06 ENCOUNTER — Emergency Department (HOSPITAL_COMMUNITY): Payer: No Typology Code available for payment source

## 2016-01-06 ENCOUNTER — Emergency Department (HOSPITAL_COMMUNITY): Payer: Self-pay

## 2016-01-06 ENCOUNTER — Emergency Department (HOSPITAL_COMMUNITY)
Admission: EM | Admit: 2016-01-06 | Discharge: 2016-01-06 | Disposition: A | Discharge status: Home / Self Care | Payer: Self-pay | Attending: Emergency Medicine | Admitting: Emergency Medicine

## 2016-01-06 DIAGNOSIS — S8254XA Nondisplaced fracture of medial malleolus of right tibia, initial encounter for closed fracture: Secondary | ICD-10-CM | POA: Insufficient documentation

## 2016-01-06 DIAGNOSIS — F17200 Nicotine dependence, unspecified, uncomplicated: Secondary | ICD-10-CM | POA: Insufficient documentation

## 2016-01-06 DIAGNOSIS — S8251XA Displaced fracture of medial malleolus of right tibia, initial encounter for closed fracture: Secondary | ICD-10-CM

## 2016-01-06 DIAGNOSIS — S0081XA Abrasion of other part of head, initial encounter: Secondary | ICD-10-CM | POA: Insufficient documentation

## 2016-01-06 DIAGNOSIS — S9001XA Contusion of right ankle, initial encounter: Secondary | ICD-10-CM | POA: Insufficient documentation

## 2016-01-06 DIAGNOSIS — Y92410 Unspecified street and highway as the place of occurrence of the external cause: Secondary | ICD-10-CM | POA: Insufficient documentation

## 2016-01-06 DIAGNOSIS — S3991XA Unspecified injury of abdomen, initial encounter: Secondary | ICD-10-CM | POA: Insufficient documentation

## 2016-01-06 DIAGNOSIS — S29001A Unspecified injury of muscle and tendon of front wall of thorax, initial encounter: Secondary | ICD-10-CM | POA: Insufficient documentation

## 2016-01-06 DIAGNOSIS — F419 Anxiety disorder, unspecified: Secondary | ICD-10-CM | POA: Insufficient documentation

## 2016-01-06 DIAGNOSIS — Y9389 Activity, other specified: Secondary | ICD-10-CM | POA: Insufficient documentation

## 2016-01-06 DIAGNOSIS — Y998 Other external cause status: Secondary | ICD-10-CM | POA: Insufficient documentation

## 2016-01-06 DIAGNOSIS — Z79899 Other long term (current) drug therapy: Secondary | ICD-10-CM | POA: Insufficient documentation

## 2016-01-06 DIAGNOSIS — S90122A Contusion of left lesser toe(s) without damage to nail, initial encounter: Secondary | ICD-10-CM | POA: Insufficient documentation

## 2016-01-06 DIAGNOSIS — Z791 Long term (current) use of non-steroidal anti-inflammatories (NSAID): Secondary | ICD-10-CM | POA: Insufficient documentation

## 2016-01-06 DIAGNOSIS — Z23 Encounter for immunization: Secondary | ICD-10-CM | POA: Insufficient documentation

## 2016-01-06 DIAGNOSIS — Z792 Long term (current) use of antibiotics: Secondary | ICD-10-CM | POA: Insufficient documentation

## 2016-01-06 DIAGNOSIS — S060X1A Concussion with loss of consciousness of 30 minutes or less, initial encounter: Secondary | ICD-10-CM | POA: Insufficient documentation

## 2016-01-06 LAB — URINALYSIS, ROUTINE W REFLEX MICROSCOPIC
Bilirubin Urine: NEGATIVE
GLUCOSE, UA: NEGATIVE mg/dL
HGB URINE DIPSTICK: NEGATIVE
Ketones, ur: NEGATIVE mg/dL
LEUKOCYTES UA: NEGATIVE
Nitrite: NEGATIVE
PROTEIN: NEGATIVE mg/dL
SPECIFIC GRAVITY, URINE: 1.028 (ref 1.005–1.030)
pH: 6.5 (ref 5.0–8.0)

## 2016-01-06 LAB — I-STAT CHEM 8, ED
BUN: 16 mg/dL (ref 6–20)
CALCIUM ION: 1.17 mmol/L (ref 1.12–1.23)
CHLORIDE: 103 mmol/L (ref 101–111)
Creatinine, Ser: 1 mg/dL (ref 0.61–1.24)
GLUCOSE: 100 mg/dL — AB (ref 65–99)
HCT: 42 % (ref 39.0–52.0)
HEMOGLOBIN: 14.3 g/dL (ref 13.0–17.0)
Potassium: 3.9 mmol/L (ref 3.5–5.1)
SODIUM: 143 mmol/L (ref 135–145)
TCO2: 26 mmol/L (ref 0–100)

## 2016-01-06 LAB — CBC
HEMATOCRIT: 38.3 % — AB (ref 39.0–52.0)
HEMOGLOBIN: 13.5 g/dL (ref 13.0–17.0)
MCH: 32.8 pg (ref 26.0–34.0)
MCHC: 35.2 g/dL (ref 30.0–36.0)
MCV: 93 fL (ref 78.0–100.0)
Platelets: 265 10*3/uL (ref 150–400)
RBC: 4.12 MIL/uL — AB (ref 4.22–5.81)
RDW: 12.4 % (ref 11.5–15.5)
WBC: 10.8 10*3/uL — ABNORMAL HIGH (ref 4.0–10.5)

## 2016-01-06 LAB — ETHANOL

## 2016-01-06 LAB — COMPREHENSIVE METABOLIC PANEL
ALBUMIN: 3.8 g/dL (ref 3.5–5.0)
ALT: 16 U/L — ABNORMAL LOW (ref 17–63)
ANION GAP: 11 (ref 5–15)
AST: 19 U/L (ref 15–41)
Alkaline Phosphatase: 57 U/L (ref 38–126)
BUN: 13 mg/dL (ref 6–20)
CALCIUM: 9.5 mg/dL (ref 8.9–10.3)
CHLORIDE: 102 mmol/L (ref 101–111)
CO2: 26 mmol/L (ref 22–32)
Creatinine, Ser: 0.96 mg/dL (ref 0.61–1.24)
GFR calc non Af Amer: >60 mL/min (ref >60)
GLUCOSE: 102 mg/dL — AB (ref 65–99)
POTASSIUM: 3.9 mmol/L (ref 3.5–5.1)
SODIUM: 139 mmol/L (ref 135–145)
Total Bilirubin: 0.2 mg/dL — ABNORMAL LOW (ref 0.3–1.2)
Total Protein: 6.8 g/dL (ref 6.5–8.1)

## 2016-01-06 MED ORDER — HYDROMORPHONE HCL 1 MG/ML IJ SOLN
1.0000 mg | Freq: Once | INTRAMUSCULAR | Status: AC
  Administered 2016-01-06: 1 mg via INTRAVENOUS
  Filled 2016-01-06: qty 1

## 2016-01-06 MED ORDER — HYDROCODONE-ACETAMINOPHEN 5-325 MG PO TABS
1.0000 | ORAL_TABLET | Freq: Once | ORAL | Status: AC
Start: 2016-01-06 — End: 2016-01-06
  Administered 2016-01-06: 1 via ORAL
  Filled 2016-01-06: qty 1

## 2016-01-06 MED ORDER — ONDANSETRON HCL 4 MG/2ML IJ SOLN
4.0000 mg | Freq: Once | INTRAMUSCULAR | Status: AC
  Administered 2016-01-06: 4 mg via INTRAVENOUS
  Filled 2016-01-06: qty 2

## 2016-01-06 MED ORDER — OXYCODONE-ACETAMINOPHEN 5-325 MG PO TABS: 1.0000 | ORAL_TABLET | Freq: Three times a day (TID) | ORAL | Status: DC | PRN

## 2016-01-06 MED ORDER — MORPHINE SULFATE (PF) 4 MG/ML IV SOLN
4.0000 mg | Freq: Once | INTRAVENOUS | Status: AC
  Administered 2016-01-06: 4 mg via INTRAVENOUS
  Filled 2016-01-06: qty 1

## 2016-01-06 MED ORDER — SODIUM CHLORIDE 0.9 % IV BOLUS (SEPSIS)
1000.0000 mL | Freq: Once | INTRAVENOUS | Status: AC
  Administered 2016-01-06: 1000 mL via INTRAVENOUS

## 2016-01-06 MED ORDER — LORAZEPAM 2 MG/ML IJ SOLN
1.0000 mg | Freq: Once | INTRAMUSCULAR | Status: AC
  Administered 2016-01-06: 1 mg via INTRAVENOUS
  Filled 2016-01-06: qty 1

## 2016-01-06 MED ORDER — TETANUS-DIPHTH-ACELL PERTUSSIS 5-2.5-18.5 LF-MCG/0.5 IM SUSP
0.5000 mL | Freq: Once | INTRAMUSCULAR | Status: AC
  Administered 2016-01-06: 0.5 mL via INTRAMUSCULAR
  Filled 2016-01-06: qty 0.5

## 2016-01-06 MED ORDER — NAPROXEN 500 MG PO TABS: 500.0000 mg | ORAL_TABLET | Freq: Two times a day (BID) | ORAL | Status: DC

## 2016-01-06 MED ORDER — SODIUM CHLORIDE 0.9 % IV SOLN
INTRAVENOUS | Status: DC
  Administered 2016-01-06: 125 mL/h via INTRAVENOUS

## 2016-01-06 MED ORDER — IOPAMIDOL (ISOVUE-300) INJECTION 61%
INTRAVENOUS | Status: AC
  Administered 2016-01-06: 100 mL via INTRAVENOUS
  Filled 2016-01-06: qty 100

## 2016-01-06 NOTE — ED Notes (Signed)
Pt oob to br via w/c 104

## 2016-01-06 NOTE — ED Provider Notes (Signed)
10:37 AM Patient in no distress. He was made aware of all remaining radiographic studies, including CT abdomen, CT chest. He has no ongoing respiratory concerns, is not tachypneic, tachycardic. Patient is speaking clearly on the phone, no evidence for respiratory compromise, though CT suggests possible atelectasis versus aspiration. Patient discharged to follow-up with orthopedists for his ankle fracture, primary care for additional pain control as needed.    Gerhard Munchobert Tevis Dunavan, MD 01/06/16 1038

## 2016-01-06 NOTE — ED Notes (Signed)
C collar removed after verbal orders from Dr. Jeraldine LootsLockwood.
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
## 2016-01-06 NOTE — ED Notes (Signed)
Pt returns from CT.
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
## 2016-01-06 NOTE — ED Notes (Signed)
Patient transported to X-ray 

## 2016-01-06 NOTE — Progress Notes (Signed)
Orthopedic Tech Progress Note Patient Details:  Joel Newton Joel Newton [birth year] [id number]  Ortho Devices Type of Ortho Device: Ace wrap, Crutches, Post (short leg) splint, Stirrup splint Ortho Device/Splint Interventions: Application   [name] 01/06/2016, 8:08 AM

## 2016-01-06 NOTE — ED Notes (Signed)
Pt states he understands instructions. Home with brother.

## 2016-01-06 NOTE — ED Notes (Signed)
Patient transported to CT 

## 2018-11-14 ENCOUNTER — Emergency Department (HOSPITAL_COMMUNITY)
Admission: EM | Admit: 2018-11-14 | Discharge: 2018-11-14 | Disposition: A | Discharge status: Home / Self Care | Payer: Medicaid Other | Attending: Emergency Medicine | Admitting: Emergency Medicine

## 2018-11-14 ENCOUNTER — Other Ambulatory Visit: Payer: Self-pay

## 2018-11-14 ENCOUNTER — Emergency Department (HOSPITAL_COMMUNITY): Payer: Medicaid Other

## 2018-11-14 ENCOUNTER — Encounter (HOSPITAL_COMMUNITY): Payer: Self-pay | Admitting: Emergency Medicine

## 2018-11-14 DIAGNOSIS — S8992XA Unspecified injury of left lower leg, initial encounter: Secondary | ICD-10-CM | POA: Diagnosis present

## 2018-11-14 DIAGNOSIS — T148XXA Other injury of unspecified body region, initial encounter: Secondary | ICD-10-CM | POA: Diagnosis not present

## 2018-11-14 DIAGNOSIS — Y939 Activity, unspecified: Secondary | ICD-10-CM | POA: Diagnosis not present

## 2018-11-14 DIAGNOSIS — T07XXXA Unspecified multiple injuries, initial encounter: Secondary | ICD-10-CM

## 2018-11-14 DIAGNOSIS — S62642A Nondisplaced fracture of proximal phalanx of right middle finger, initial encounter for closed fracture: Secondary | ICD-10-CM | POA: Diagnosis not present

## 2018-11-14 DIAGNOSIS — F17210 Nicotine dependence, cigarettes, uncomplicated: Secondary | ICD-10-CM | POA: Diagnosis not present

## 2018-11-14 DIAGNOSIS — Y92410 Unspecified street and highway as the place of occurrence of the external cause: Secondary | ICD-10-CM | POA: Insufficient documentation

## 2018-11-14 DIAGNOSIS — S8012XA Contusion of left lower leg, initial encounter: Secondary | ICD-10-CM

## 2018-11-14 DIAGNOSIS — M25462 Effusion, left knee: Secondary | ICD-10-CM | POA: Diagnosis not present

## 2018-11-14 DIAGNOSIS — Y998 Other external cause status: Secondary | ICD-10-CM | POA: Insufficient documentation

## 2018-11-14 DIAGNOSIS — S62619A Displaced fracture of proximal phalanx of unspecified finger, initial encounter for closed fracture: Secondary | ICD-10-CM

## 2018-11-14 MED ORDER — NAPROXEN 500 MG PO TABS: 500.0000 mg | ORAL_TABLET | Freq: Two times a day (BID) | ORAL | 0 refills | Status: DC

## 2018-11-14 MED ORDER — HYDROMORPHONE HCL 1 MG/ML IJ SOLN
0.5000 mg | Freq: Once | INTRAMUSCULAR | Status: AC
  Administered 2018-11-14: 0.5 mg via INTRAVENOUS
  Filled 2018-11-14: qty 1

## 2018-11-14 MED ORDER — ONDANSETRON HCL 4 MG/2ML IJ SOLN
4.0000 mg | Freq: Once | INTRAMUSCULAR | Status: AC
  Administered 2018-11-14: 4 mg via INTRAVENOUS
  Filled 2018-11-14: qty 2

## 2018-11-14 MED ORDER — OXYCODONE-ACETAMINOPHEN 5-325 MG PO TABS: 1.0000 | ORAL_TABLET | Freq: Four times a day (QID) | ORAL | 0 refills | Status: DC | PRN

## 2018-11-14 MED ORDER — FENTANYL CITRATE (PF) 100 MCG/2ML IJ SOLN
100.0000 ug | Freq: Once | INTRAMUSCULAR | Status: AC
  Administered 2018-11-14: 100 ug via INTRAVENOUS
  Filled 2018-11-14: qty 2

## 2018-11-14 NOTE — Progress Notes (Signed)
Orthopedic Tech Progress Note Patient Details:  Joel Newton [birth year] [id number] RN called up here wanting me to come and apply the knee immobilizer and crutches. But she did crutch training with him.  Ortho Devices Type of Ortho Device: Crutches, Knee Immobilizer Ortho Device/Splint Location: LLE Ortho Device/Splint Interventions: Adjustment, Application, Ordered   Post Interventions Patient Tolerated: Well Instructions Provided: Care of device, Adjustment of device   [name] 11/14/2018, 11:29 AM

## 2018-11-14 NOTE — ED Notes (Signed)
"  Patient verbalizes understanding of discharge instructions. Opportunity for questioning and answers were provided.  pt discharged from ED ."  

## 2018-11-14 NOTE — Progress Notes (Signed)
Orthopedic Tech Progress Note Patient Details:  Joel Newton [birth year] [id number] Patient refused the crutches and the knee immobilizer. I told the DR and let a RN know what the patient said. Ortho Devices Type of Ortho Device: Short arm splint Ortho Device/Splint Location: URE Ortho Device/Splint Interventions: Adjustment, Application, Ordered   Post Interventions Patient Tolerated: Well Instructions Provided: Care of device, Adjustment of device   [name] 11/14/2018, 10:19 AM

## 2018-11-14 NOTE — Discharge Instructions (Signed)
Please read and follow all provided instructions.  Your diagnoses today include:  1. Contusion of multiple sites of left lower extremity, initial encounter   2. Effusion of left knee   3. Closed fracture of proximal phalanx of digit of right hand, initial encounter   4. Abrasions of multiple sites     Tests performed today include:  An x-ray of the affected areas - no knee fracture, fracture of the base of the right index finger  Vital signs. See below for your results today.   Medications prescribed:   Percocet (oxycodone/acetaminophen) - narcotic pain medication  DO NOT drive or perform any activities that require you to be awake and alert because this medicine can make you drowsy. BE VERY CAREFUL not to take multiple medicines containing Tylenol (also called acetaminophen). Doing so can lead to an overdose which can damage your liver and cause liver failure and possibly death.   Naproxen - anti-inflammatory pain medication  Do not exceed 500mg  naproxen every 12 hours, take with food  You have been prescribed an anti-inflammatory medication or NSAID. Take with food. Take smallest effective dose for the shortest duration needed for your pain. Stop taking if you experience stomach pain or vomiting.   Take any prescribed medications only as directed.  Home care instructions:   Follow any educational materials contained in this packet  Follow R.I.C.E. Protocol:  R - rest your injury   I  - use ice on injury without applying directly to skin  C - compress injury with bandage or splint  E - elevate the injury as much as possible  Follow-up instructions: Please follow-up with the provided orthopedic physician (bone specialist).   Return instructions:   Please return if your toes or feet are numb or tingling, appear gray or blue, or you have severe pain (also elevate the leg and loosen splint or wrap if you were given one)  Please return to the Emergency Department if you  experience worsening symptoms.   Please return if you have any other emergent concerns.  Additional Information:  Your vital signs today were: BP (!) 150/99    Pulse 89    Temp 100.2 F (37.9 C) (Oral)    Resp 16    SpO2 95%  If your blood pressure (BP) was elevated above 135/85 this visit, please have this repeated by your doctor within one month. -------------- Use crutches until cleared by the orthopedic doctor you were referred to. --------------
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
## 2018-11-14 NOTE — ED Notes (Signed)
Pt transported to xray 
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
## 2018-11-30 ENCOUNTER — Other Ambulatory Visit: Payer: Self-pay | Admitting: Student

## 2018-11-30 DIAGNOSIS — T1490XA Injury, unspecified, initial encounter: Secondary | ICD-10-CM

## 2018-12-01 ENCOUNTER — Other Ambulatory Visit: Payer: Self-pay

## 2018-12-01 ENCOUNTER — Ambulatory Visit
Admission: RE | Admit: 2018-12-01 | Discharge: 2018-12-01 | Disposition: A | Discharge status: Home / Self Care | Payer: Medicaid Other | Source: Ambulatory Visit | Attending: Student | Admitting: Student

## 2018-12-01 DIAGNOSIS — T1490XA Injury, unspecified, initial encounter: Secondary | ICD-10-CM

## 2020-01-06 ENCOUNTER — Encounter (HOSPITAL_COMMUNITY): Payer: Self-pay | Admitting: Emergency Medicine

## 2020-01-06 ENCOUNTER — Emergency Department (HOSPITAL_COMMUNITY)
Admission: EM | Admit: 2020-01-06 | Discharge: 2020-01-06 | Disposition: A | Discharge status: Home / Self Care | Payer: Medicaid Other | Attending: Emergency Medicine | Admitting: Emergency Medicine

## 2020-01-06 DIAGNOSIS — R1084 Generalized abdominal pain: Secondary | ICD-10-CM | POA: Insufficient documentation

## 2020-01-06 DIAGNOSIS — R197 Diarrhea, unspecified: Secondary | ICD-10-CM | POA: Insufficient documentation

## 2020-01-06 DIAGNOSIS — M7918 Myalgia, other site: Secondary | ICD-10-CM | POA: Insufficient documentation

## 2020-01-06 DIAGNOSIS — F1193 Opioid use, unspecified with withdrawal: Secondary | ICD-10-CM

## 2020-01-06 DIAGNOSIS — F1123 Opioid dependence with withdrawal: Secondary | ICD-10-CM | POA: Diagnosis not present

## 2020-01-06 DIAGNOSIS — F17210 Nicotine dependence, cigarettes, uncomplicated: Secondary | ICD-10-CM | POA: Diagnosis not present

## 2020-01-06 DIAGNOSIS — R112 Nausea with vomiting, unspecified: Secondary | ICD-10-CM | POA: Diagnosis present

## 2020-01-06 LAB — COMPREHENSIVE METABOLIC PANEL
ALT: 25 U/L (ref 0–44)
AST: 19 U/L (ref 15–41)
Albumin: 4.9 g/dL (ref 3.5–5.0)
Alkaline Phosphatase: 81 U/L (ref 38–126)
Anion gap: 14 (ref 5–15)
BUN: 17 mg/dL (ref 6–20)
CO2: 27 mmol/L (ref 22–32)
Calcium: 10.1 mg/dL (ref 8.9–10.3)
Chloride: 96 mmol/L — ABNORMAL LOW (ref 98–111)
Creatinine, Ser: 1.17 mg/dL (ref 0.61–1.24)
GFR calc Af Amer: >60 mL/min (ref >60)
GFR calc non Af Amer: >60 mL/min (ref >60)
Glucose, Bld: 121 mg/dL — ABNORMAL HIGH (ref 70–99)
Potassium: 3.6 mmol/L (ref 3.5–5.1)
Sodium: 137 mmol/L (ref 135–145)
Total Bilirubin: 1.1 mg/dL (ref 0.3–1.2)
Total Protein: 8.5 g/dL — ABNORMAL HIGH (ref 6.5–8.1)

## 2020-01-06 LAB — CBC
HCT: 48.1 % (ref 39.0–52.0)
Hemoglobin: 16.8 g/dL (ref 13.0–17.0)
MCH: 32.6 pg (ref 26.0–34.0)
MCHC: 34.9 g/dL (ref 30.0–36.0)
MCV: 93.4 fL (ref 80.0–100.0)
Platelets: 335 10*3/uL (ref 150–400)
RBC: 5.15 MIL/uL (ref 4.22–5.81)
RDW: 12 % (ref 11.5–15.5)
WBC: 11.3 10*3/uL — ABNORMAL HIGH (ref 4.0–10.5)
nRBC: 0 % (ref 0.0–0.2)

## 2020-01-06 LAB — ETHANOL: Alcohol, Ethyl (B): <10 mg/dL (ref <10)

## 2020-01-06 MED ORDER — CLONIDINE HCL 0.1 MG PO TABS
0.1000 mg | ORAL_TABLET | Freq: Once | ORAL | Status: AC
  Administered 2020-01-06: 0.1 mg via ORAL
  Filled 2020-01-06: qty 1

## 2020-01-06 MED ORDER — CLONIDINE HCL 0.1 MG PO TABS: 0.1000 mg | ORAL_TABLET | Freq: Three times a day (TID) | ORAL | 0 refills | Status: DC

## 2020-01-06 MED ORDER — ONDANSETRON HCL 4 MG PO TABS: 4.0000 mg | ORAL_TABLET | Freq: Four times a day (QID) | ORAL | 0 refills | Status: DC

## 2020-01-06 MED ORDER — ONDANSETRON HCL 4 MG PO TABS
4.0000 mg | ORAL_TABLET | Freq: Four times a day (QID) | ORAL | 0 refills | Status: AC
Start: 2020-01-06 — End: 2020-01-11

## 2020-01-06 MED ORDER — SODIUM CHLORIDE 0.9 % IV BOLUS
1000.0000 mL | Freq: Once | INTRAVENOUS | Status: AC
  Administered 2020-01-06: 1000 mL via INTRAVENOUS

## 2020-01-06 MED ORDER — ONDANSETRON HCL 4 MG/2ML IJ SOLN
4.0000 mg | Freq: Once | INTRAMUSCULAR | Status: AC
  Administered 2020-01-06: 4 mg via INTRAVENOUS
  Filled 2020-01-06: qty 2

## 2020-01-06 NOTE — ED Provider Notes (Signed)
[facility] EMERGENCY DEPARTMENT Provider Note   CSN: [id number] Arrival date & time: 01/06/20  1432     History Chief Complaint  Patient presents with  . detox    Joel Newton is a 29 y.o. male.  The history is provided by the patient.  Illness Location:  General Severity:  Mild Onset quality:  Gradual Timing:  Intermittent Progression:  Waxing and waning Chronicity:  New Context:  Patient here for opoid withdrawl symptoms Relieved by:  Nothing Worsened by:  Nothing  Associated symptoms: abdominal pain, diarrhea, myalgias and nausea   Associated symptoms: no chest pain, no cough, no ear pain, no fever, no rash, no shortness of breath, no sore throat and no vomiting        History reviewed. No pertinent past medical history.  There are no problems to display for this patient.   Past Surgical History:  Procedure Laterality Date  . arm surgery    . LEG SURGERY         No family history on file.  Social History   Tobacco Use  . Smoking status: Current Every Day Smoker    Types: Cigarettes  . Smokeless tobacco: Never Used  Substance Use Topics  . Alcohol use: No  . Drug use: Yes    Types: Marijuana    Home Medications Prior to Admission medications   Medication Sig Start Date End Date Taking? Authorizing Provider  acetaminophen (TYLENOL) 500 MG tablet Take 500 mg by mouth every 6 (six) hours as needed for headache.   Yes [provider]  PRESCRIPTION MEDICATION Take 55 mg by mouth See admin instructions. Takes 55 mg of Methadone ( Mix with 1 oz of water)   Yes [provider]  cloNIDine (CATAPRES) 0.1 MG tablet Take 1 tablet (0.1 mg total) by mouth 3 (three) times daily for 4 days. 01/06/20 01/10/20  [name], DO  naproxen (NAPROSYN) 500 MG tablet Take 1 tablet (500 mg total) by mouth 2 (two) times daily. Patient not taking: Reported on 01/06/2020 11/14/18   [name], [name]  ondansetron (ZOFRAN) 4 MG  tablet Take 1 tablet (4 mg total) by mouth every 6 (six) hours for 20 doses. 01/06/20 01/11/20  [name], DO  oxyCODONE-acetaminophen (PERCOCET/ROXICET) 5-325 MG tablet Take 1 tablet by mouth every 6 (six) hours as needed for severe pain. Patient not taking: Reported on 01/06/2020 11/14/18   [name], [name]    Allergies    Patient has no known allergies.  Review of Systems   Review of Systems  Constitutional: Negative for chills and fever.  HENT: Negative for ear pain and sore throat.   Eyes: Negative for pain and visual disturbance.  Respiratory: Negative for cough and shortness of breath.   Cardiovascular: Negative for chest pain and palpitations.  Gastrointestinal: Positive for abdominal pain, diarrhea and nausea. Negative for vomiting.  Genitourinary: Negative for dysuria and hematuria.  Musculoskeletal: Positive for myalgias. Negative for arthralgias and back pain.  Skin: Negative for color change and rash.  Neurological: Negative for seizures and syncope.  All other systems reviewed and are negative.   Physical Exam Updated Vital Signs BP 127/81   Pulse 64   Temp 99.9 F (37.7 C) (Oral)   Resp 16   SpO2 97%   Physical Exam Vitals and nursing note reviewed.  Constitutional:      General: He is not in acute distress.    Appearance: He is well-developed. He is not ill-appearing.  HENT:  Head: Normocephalic and atraumatic.     Nose: Nose normal.     Mouth/Throat:     Mouth: Mucous membranes are moist.  Eyes:     Extraocular Movements: Extraocular movements intact.     Conjunctiva/sclera: Conjunctivae normal.     Pupils: Pupils are equal, round, and reactive to light.  Cardiovascular:     Rate and Rhythm: Normal rate and regular rhythm.     Pulses: Normal pulses.     Heart sounds: Normal heart sounds. No murmur.  Pulmonary:     Effort: Pulmonary effort is normal. No respiratory distress.     Breath sounds: Normal breath sounds.  Abdominal:      Palpations: Abdomen is soft.     Tenderness: There is no abdominal tenderness.  Musculoskeletal:        General: Normal range of motion.     Cervical back: Normal range of motion and neck supple.  Skin:    General: Skin is warm and dry.  Neurological:     General: No focal deficit present.     Mental Status: He is alert.  Psychiatric:        Mood and Affect: Mood normal.     ED Results / Procedures / Treatments   Labs (all labs ordered are listed, but only abnormal results are displayed) Labs Reviewed  COMPREHENSIVE METABOLIC PANEL - Abnormal; Notable for the following components:      Result Value   Chloride 96 (*)    Glucose, Bld 121 (*)    Total Protein 8.5 (*)    All other components within normal limits  CBC - Abnormal; Notable for the following components:   WBC 11.3 (*)    All other components within normal limits  ETHANOL    EKG None  Radiology No results found.  Procedures Procedures (including critical care time)  Medications Ordered in ED Medications  sodium chloride 0.9 % bolus 1,000 mL (0 mLs Intravenous Stopped 01/06/20 2039)  cloNIDine (CATAPRES) tablet 0.1 mg (0.1 mg Oral Given 01/06/20 1933)  ondansetron (ZOFRAN) injection 4 mg (4 mg Intravenous Given 01/06/20 1940)    ED Course  I have reviewed the triage vital signs and the nursing notes.  Pertinent labs & imaging results that were available during my care of the patient were reviewed by me and considered in my medical decision making (see chart for details).    MDM Rules/Calculators/A&P                      Joel Newton is a 29 year old male with history of heroin abuse who presents to the ED with opioid withdrawal symptoms.  Patient last used heroin yesterday.  Patient however is a part of a methadone clinic.  Did get his first dose of methadone again today.  Has been having some nausea, vomiting, diarrhea, body aches.  Overall normal vitals.  No fever.  Patient overall appears well.   Does not to be in much distress.  Will give IV fluids, clonidine, Zofran.  He does have his methadone for the weekend.  Will likely prescribe clonidine, Zofran for symptomatic relief.  Lab work overall unremarkable.  Will prescribe clonidine, Zofran.  Understands return precautions.  This chart was dictated using voice recognition software.  Despite best efforts to proofread,  errors can occur which can change the documentation meaning.     Final Clinical Impression(s) / ED Diagnoses Final diagnoses:  Opioid withdrawal (HCC)    Rx / DC Orders  ED Discharge Orders         Ordered    cloNIDine (CATAPRES) 0.1 MG tablet  3 times daily     01/06/20 2209    ondansetron (ZOFRAN) 4 MG tablet  Every 6 hours     01/06/20 2209           Virgina Norfolk, DO 01/06/20 2209

## 2020-01-06 NOTE — ED Notes (Signed)
Patient verbalizes understanding of discharge instructions. Opportunity for questioning and answers were provided. Armband removed by staff, pt discharged from ED stable & ambulatory  

## 2020-01-06 NOTE — ED Triage Notes (Signed)
Pt here for detox from heroine pt states he is in a methadone clinic but relapsed yesterday and now feeling sick- was able to still get his methadone dose today but could not keep it down due to N/V.

## 2020-10-30 ENCOUNTER — Encounter (HOSPITAL_COMMUNITY): Payer: Self-pay

## 2020-10-30 ENCOUNTER — Emergency Department (HOSPITAL_COMMUNITY): Payer: Medicaid Other

## 2020-10-30 ENCOUNTER — Inpatient Hospital Stay (HOSPITAL_COMMUNITY)
Admission: EM | Admit: 2020-10-30 | Discharge: 2020-11-02 | DRG: 603 | Disposition: A | Discharge status: Home / Self Care | Payer: Medicaid Other | Attending: Internal Medicine | Admitting: Internal Medicine

## 2020-10-30 ENCOUNTER — Inpatient Hospital Stay (HOSPITAL_COMMUNITY): Payer: Medicaid Other

## 2020-10-30 ENCOUNTER — Other Ambulatory Visit: Payer: Self-pay

## 2020-10-30 DIAGNOSIS — M009 Pyogenic arthritis, unspecified: Secondary | ICD-10-CM

## 2020-10-30 DIAGNOSIS — R609 Edema, unspecified: Secondary | ICD-10-CM | POA: Diagnosis present

## 2020-10-30 DIAGNOSIS — E669 Obesity, unspecified: Secondary | ICD-10-CM | POA: Diagnosis present

## 2020-10-30 DIAGNOSIS — F17210 Nicotine dependence, cigarettes, uncomplicated: Secondary | ICD-10-CM | POA: Diagnosis present

## 2020-10-30 DIAGNOSIS — R7982 Elevated C-reactive protein (CRP): Secondary | ICD-10-CM | POA: Diagnosis present

## 2020-10-30 DIAGNOSIS — F1910 Other psychoactive substance abuse, uncomplicated: Secondary | ICD-10-CM | POA: Diagnosis present

## 2020-10-30 DIAGNOSIS — R7303 Prediabetes: Secondary | ICD-10-CM | POA: Diagnosis present

## 2020-10-30 DIAGNOSIS — L03116 Cellulitis of left lower limb: Principal | ICD-10-CM | POA: Diagnosis present

## 2020-10-30 DIAGNOSIS — Z20822 Contact with and (suspected) exposure to covid-19: Secondary | ICD-10-CM | POA: Diagnosis present

## 2020-10-30 DIAGNOSIS — F1990 Other psychoactive substance use, unspecified, uncomplicated: Secondary | ICD-10-CM

## 2020-10-30 DIAGNOSIS — E872 Acidosis: Secondary | ICD-10-CM | POA: Diagnosis present

## 2020-10-30 DIAGNOSIS — Z6830 Body mass index (BMI) 30.0-30.9, adult: Secondary | ICD-10-CM | POA: Diagnosis not present

## 2020-10-30 DIAGNOSIS — A419 Sepsis, unspecified organism: Secondary | ICD-10-CM

## 2020-10-30 LAB — URINALYSIS, ROUTINE W REFLEX MICROSCOPIC
Bilirubin Urine: NEGATIVE
Glucose, UA: NEGATIVE mg/dL
Hgb urine dipstick: NEGATIVE
Ketones, ur: 20 mg/dL — AB
Leukocytes,Ua: NEGATIVE
Nitrite: NEGATIVE
Protein, ur: NEGATIVE mg/dL
Specific Gravity, Urine: 1.014 (ref 1.005–1.030)
pH: 8 (ref 5.0–8.0)

## 2020-10-30 LAB — COMPREHENSIVE METABOLIC PANEL
ALT: 25 U/L (ref 0–44)
AST: 23 U/L (ref 15–41)
Albumin: 4.5 g/dL (ref 3.5–5.0)
Alkaline Phosphatase: 89 U/L (ref 38–126)
Anion gap: 13 (ref 5–15)
BUN: 20 mg/dL (ref 6–20)
CO2: 24 mmol/L (ref 22–32)
Calcium: 9.8 mg/dL (ref 8.9–10.3)
Chloride: 98 mmol/L (ref 98–111)
Creatinine, Ser: 1.08 mg/dL (ref 0.61–1.24)
GFR, Estimated: >60 mL/min (ref >60)
Glucose, Bld: 119 mg/dL — ABNORMAL HIGH (ref 70–99)
Potassium: 3.5 mmol/L (ref 3.5–5.1)
Sodium: 135 mmol/L (ref 135–145)
Total Bilirubin: 1.1 mg/dL (ref 0.3–1.2)
Total Protein: 8 g/dL (ref 6.5–8.1)

## 2020-10-30 LAB — CBC WITH DIFFERENTIAL/PLATELET
Abs Immature Granulocytes: 0.05 10*3/uL (ref 0.00–0.07)
Basophils Absolute: 0.1 10*3/uL (ref 0.0–0.1)
Basophils Relative: 1 %
Eosinophils Absolute: 0.1 10*3/uL (ref 0.0–0.5)
Eosinophils Relative: 1 %
HCT: 37.7 % — ABNORMAL LOW (ref 39.0–52.0)
Hemoglobin: 13.4 g/dL (ref 13.0–17.0)
Immature Granulocytes: 1 %
Lymphocytes Relative: 16 %
Lymphs Abs: 1.6 10*3/uL (ref 0.7–4.0)
MCH: 32.1 pg (ref 26.0–34.0)
MCHC: 35.5 g/dL (ref 30.0–36.0)
MCV: 90.4 fL (ref 80.0–100.0)
Monocytes Absolute: 1 10*3/uL (ref 0.1–1.0)
Monocytes Relative: 10 %
Neutro Abs: 7.1 10*3/uL (ref 1.7–7.7)
Neutrophils Relative %: 71 %
Platelets: 302 10*3/uL (ref 150–400)
RBC: 4.17 MIL/uL — ABNORMAL LOW (ref 4.22–5.81)
RDW: 12.2 % (ref 11.5–15.5)
WBC: 9.9 10*3/uL (ref 4.0–10.5)
nRBC: 0 % (ref 0.0–0.2)

## 2020-10-30 LAB — HEMOGLOBIN A1C
Hgb A1c MFr Bld: 6.1 % — ABNORMAL HIGH (ref 4.8–5.6)
Mean Plasma Glucose: 128.37 mg/dL

## 2020-10-30 LAB — RAPID URINE DRUG SCREEN, HOSP PERFORMED
Amphetamines: POSITIVE — AB
Barbiturates: NOT DETECTED
Benzodiazepines: NOT DETECTED
Cocaine: NOT DETECTED
Opiates: NOT DETECTED
Tetrahydrocannabinol: POSITIVE — AB

## 2020-10-30 LAB — URIC ACID: Uric Acid, Serum: 5.8 mg/dL (ref 3.7–8.6)

## 2020-10-30 LAB — APTT: aPTT: 29 seconds (ref 24–36)

## 2020-10-30 LAB — LACTIC ACID, PLASMA
Lactic Acid, Venous: 2.2 mmol/L (ref 0.5–1.9)
Lactic Acid, Venous: 1.2 mmol/L (ref 0.5–1.9)

## 2020-10-30 LAB — SEDIMENTATION RATE: Sed Rate: 20 mm/hr — ABNORMAL HIGH (ref 0–16)

## 2020-10-30 LAB — PROTIME-INR
INR: 1.1 (ref 0.8–1.2)
Prothrombin Time: 14 seconds (ref 11.4–15.2)

## 2020-10-30 LAB — HEPATITIS PANEL, ACUTE
HCV Ab: NONREACTIVE
Hep A IgM: NONREACTIVE
Hep B C IgM: NONREACTIVE
Hepatitis B Surface Ag: NONREACTIVE

## 2020-10-30 LAB — SARS CORONAVIRUS 2 (TAT 6-24 HRS): SARS Coronavirus 2: NEGATIVE

## 2020-10-30 LAB — C-REACTIVE PROTEIN: CRP: 2.6 mg/dL — ABNORMAL HIGH (ref <1.0)

## 2020-10-30 MED ORDER — LACTATED RINGERS IV SOLN: INTRAVENOUS | Status: DC

## 2020-10-30 MED ORDER — ACETAMINOPHEN 650 MG RE SUPP: 650.0000 mg | Freq: Four times a day (QID) | RECTAL | Status: DC | PRN

## 2020-10-30 MED ORDER — CEFAZOLIN SODIUM-DEXTROSE 2-4 GM/100ML-% IV SOLN
2.0000 g | Freq: Three times a day (TID) | INTRAVENOUS | Status: DC
  Administered 2020-10-30 – 2020-11-01 (×6): 2 g via INTRAVENOUS
  Filled 2020-10-30 (×7): qty 100

## 2020-10-30 MED ORDER — SODIUM CHLORIDE 0.9 % IV SOLN
2.0000 g | Freq: Once | INTRAVENOUS | Status: AC
  Administered 2020-10-30: 2 g via INTRAVENOUS
  Filled 2020-10-30: qty 2

## 2020-10-30 MED ORDER — GADOBUTROL 1 MMOL/ML IV SOLN
9.0000 mL | Freq: Once | INTRAVENOUS | Status: AC | PRN
  Administered 2020-10-30: 9 mL via INTRAVENOUS

## 2020-10-30 MED ORDER — ACETAMINOPHEN 325 MG PO TABS
650.0000 mg | ORAL_TABLET | Freq: Four times a day (QID) | ORAL | Status: DC | PRN
  Administered 2020-10-30: 650 mg via ORAL
  Filled 2020-10-30: qty 2

## 2020-10-30 MED ORDER — VANCOMYCIN HCL 1250 MG/250ML IV SOLN
1250.0000 mg | Freq: Two times a day (BID) | INTRAVENOUS | Status: DC
  Administered 2020-10-30: 1250 mg via INTRAVENOUS
  Filled 2020-10-30: qty 250

## 2020-10-30 MED ORDER — POLYETHYLENE GLYCOL 3350 17 G PO PACK: 17.0000 g | PACK | Freq: Every day | ORAL | Status: DC | PRN

## 2020-10-30 MED ORDER — ONDANSETRON HCL 4 MG PO TABS: 4.0000 mg | ORAL_TABLET | Freq: Four times a day (QID) | ORAL | Status: DC | PRN

## 2020-10-30 MED ORDER — HYDROMORPHONE HCL 1 MG/ML IJ SOLN
0.5000 mg | INTRAMUSCULAR | Status: DC | PRN
  Administered 2020-10-30 (×2): 1 mg via INTRAVENOUS
  Filled 2020-10-30 (×2): qty 1

## 2020-10-30 MED ORDER — LIDOCAINE HCL 2 % IJ SOLN
10.0000 mL | Freq: Once | INTRAMUSCULAR | Status: AC
  Administered 2020-10-30: 200 mg
  Filled 2020-10-30: qty 20

## 2020-10-30 MED ORDER — VANCOMYCIN HCL 2000 MG/400ML IV SOLN
2000.0000 mg | Freq: Once | INTRAVENOUS | Status: AC
  Administered 2020-10-30: 2000 mg via INTRAVENOUS
  Filled 2020-10-30: qty 400

## 2020-10-30 MED ORDER — KETOROLAC TROMETHAMINE 15 MG/ML IJ SOLN
15.0000 mg | Freq: Four times a day (QID) | INTRAMUSCULAR | Status: DC | PRN
  Administered 2020-10-30: 15 mg via INTRAVENOUS
  Filled 2020-10-30: qty 1

## 2020-10-30 MED ORDER — LACTATED RINGERS IV BOLUS (SEPSIS)
1000.0000 mL | Freq: Once | INTRAVENOUS | Status: AC
  Administered 2020-10-30: 1000 mL via INTRAVENOUS

## 2020-10-30 MED ORDER — BISACODYL 5 MG PO TBEC: 5.0000 mg | DELAYED_RELEASE_TABLET | Freq: Every day | ORAL | Status: DC | PRN

## 2020-10-30 MED ORDER — ONDANSETRON HCL 4 MG/2ML IJ SOLN
4.0000 mg | Freq: Four times a day (QID) | INTRAMUSCULAR | Status: DC | PRN
  Administered 2020-10-30 (×2): 4 mg via INTRAVENOUS
  Filled 2020-10-30 (×2): qty 2

## 2020-10-30 MED ORDER — VANCOMYCIN HCL IN DEXTROSE 1-5 GM/200ML-% IV SOLN: 1000.0000 mg | Freq: Once | INTRAVENOUS | Status: DC

## 2020-10-30 MED ORDER — LACTATED RINGERS IV SOLN: INTRAVENOUS | Status: AC

## 2020-10-30 MED ORDER — HYDROCODONE-ACETAMINOPHEN 5-325 MG PO TABS
1.0000 | ORAL_TABLET | ORAL | Status: DC | PRN
  Administered 2020-10-30 – 2020-11-02 (×11): 2 via ORAL
  Filled 2020-10-30: qty 2
  Filled 2020-10-30: qty 1
  Filled 2020-10-30 (×10): qty 2

## 2020-10-30 MED ORDER — METRONIDAZOLE IN NACL 5-0.79 MG/ML-% IV SOLN
500.0000 mg | Freq: Once | INTRAVENOUS | Status: AC
  Administered 2020-10-30: 500 mg via INTRAVENOUS
  Filled 2020-10-30: qty 100

## 2020-10-30 MED ORDER — HYDROMORPHONE HCL 2 MG/ML IJ SOLN
2.0000 mg | Freq: Once | INTRAMUSCULAR | Status: AC
Start: 2020-10-30 — End: 2020-10-30
  Administered 2020-10-30: 2 mg via INTRAVENOUS
  Filled 2020-10-30: qty 1

## 2020-10-30 MED ORDER — SODIUM CHLORIDE 0.9 % IV SOLN: 2.0000 g | Freq: Three times a day (TID) | INTRAVENOUS | Status: DC

## 2020-10-30 NOTE — ED Provider Notes (Signed)
[facility] DEPT Provider Note   CSN: [id number] Arrival date & time: 10/30/20  0041     History Chief Complaint  Patient presents with  . Foot Pain    Joel Newton is a 30 y.o. male presents to the Emergency Department complaining of acute, persistent severe pain in the left ankle and foot onset around 11:30 PM.  Patient reports when he went to get out of bed he stepped on his foot and it caused severe pain.  He reports the foot is swollen.  He has associated decreased range of motion of the left ankle.  Movement and palpation make his symptoms worse.  Nothing seems to make them better.  No treatments prior to arrival.  Patient reports his last IV drug injection was approximately 2 weeks ago.  He denies injecting into his foot.  Denies a history of endocarditis.  The history is provided by the patient and medical records. No language interpreter was used.       History reviewed. No pertinent past medical history.  There are no problems to display for this patient.   Past Surgical History:  Procedure Laterality Date  . arm surgery    . LEG SURGERY         No family history on file.  Social History   Tobacco Use  . Smoking status: Current Every Day Smoker    Types: Cigarettes  . Smokeless tobacco: Never Used  Substance Use Topics  . Alcohol use: No  . Drug use: Yes    Types: Marijuana    Home Medications Prior to Admission medications   Medication Sig Start Date End Date Taking? Authorizing Provider  cloNIDine (CATAPRES) 0.1 MG tablet Take 1 tablet (0.1 mg total) by mouth 3 (three) times daily for 4 days. 01/06/20 01/10/20  Curatolo, Adam, DO  naproxen (NAPROSYN) 500 MG tablet Take 1 tablet (500 mg total) by mouth 2 (two) times daily. Patient not taking: No sig reported 11/14/18   [name], [name]  oxyCODONE-acetaminophen (PERCOCET/ROXICET) 5-325 MG tablet Take 1 tablet by mouth every 6 (six) hours as needed for severe  pain. Patient not taking: No sig reported 11/14/18   [name], [name]    Allergies    Patient has no known allergies.  Review of Systems   Review of Systems  Constitutional: Negative for appetite change, diaphoresis, fatigue, fever and unexpected weight change.  HENT: Negative for mouth sores.   Eyes: Negative for visual disturbance.  Respiratory: Negative for cough, chest tightness, shortness of breath and wheezing.   Cardiovascular: Negative for chest pain.  Gastrointestinal: Negative for abdominal pain, constipation, diarrhea, nausea and vomiting.  Endocrine: Negative for polydipsia, polyphagia and polyuria.  Genitourinary: Negative for dysuria, frequency, hematuria and urgency.  Musculoskeletal: Positive for arthralgias, joint swelling and myalgias. Negative for back pain and neck stiffness.  Skin: Positive for color change and rash.  Allergic/Immunologic: Negative for immunocompromised state.  Neurological: Negative for syncope, light-headedness and headaches.  Hematological: Does not bruise/bleed easily.  Psychiatric/Behavioral: Negative for sleep disturbance. The patient is not nervous/anxious.     Physical Exam Updated Vital Signs BP (!) 163/98 (BP Location: Right Arm)   Pulse (!) 104   Temp 97.6 F (36.4 C) (Oral)   Resp 20   Ht '5\' 9"'  (1.753 m)   Wt 90.7 kg   SpO2 97%   BMI 29.53 kg/m   Physical Exam Vitals and nursing note reviewed.  Constitutional:      General: He is  in acute distress.     Appearance: He is not diaphoretic.     Comments: Patient sobbing  HENT:     Head: Normocephalic.  Eyes:     General: No scleral icterus.    Conjunctiva/sclera: Conjunctivae normal.  Cardiovascular:     Rate and Rhythm: Regular rhythm. Tachycardia present.     Pulses:          Radial pulses are 2+ on the right side and 2+ on the left side.       Dorsalis pedis pulses are 2+ on the right side and 1+ on the left side.     Heart sounds: Normal heart sounds.   Pulmonary:     Effort: No tachypnea, accessory muscle usage, prolonged expiration, respiratory distress or retractions.     Breath sounds: No stridor.     Comments: Equal chest rise. No increased work of breathing. Abdominal:     General: There is no distension.     Palpations: Abdomen is soft.     Tenderness: There is no abdominal tenderness. There is no guarding or rebound.  Musculoskeletal:     Cervical back: Normal range of motion.     Right hip: Normal.     Left hip: Normal.     Right knee: Normal.     Left knee: Normal.     Right lower leg: Normal.     Left lower leg: Swelling and tenderness present.     Right ankle: Normal.     Right Achilles Tendon: Normal.     Left ankle: Swelling present. Tenderness present. Decreased range of motion.     Comments: Exquisite tenderness to the left ankle with almost no range of motion; tenderness to palpation of the left distal calf area  Skin:    General: Skin is warm and dry.     Capillary Refill: Capillary refill takes less than 2 seconds.     Comments: Numerous track marks to the bilateral upper extremities. Erythema with petechiae noted to the bilateral lower extremities, left significantly greater than right  Neurological:     Mental Status: He is alert.     GCS: GCS eye subscore is 4. GCS verbal subscore is 5. GCS motor subscore is 6.     Comments: Speech is clear and goal oriented.  Psychiatric:        Mood and Affect: Mood normal.          ED Results / Procedures / Treatments   Labs (all labs ordered are listed, but only abnormal results are displayed) Labs Reviewed  LACTIC ACID, PLASMA - Abnormal; Notable for the following components:      Result Value   Lactic Acid, Venous 2.2 (*)    All other components within normal limits  COMPREHENSIVE METABOLIC PANEL - Abnormal; Notable for the following components:   Glucose, Bld 119 (*)    All other components within normal limits  CBC WITH DIFFERENTIAL/PLATELET -  Abnormal; Notable for the following components:   RBC 4.17 (*)    HCT 37.7 (*)    All other components within normal limits  C-REACTIVE PROTEIN - Abnormal; Notable for the following components:   CRP 2.6 (*)    All other components within normal limits  CULTURE, BLOOD (SINGLE)  URINE CULTURE  SARS CORONAVIRUS 2 (TAT 6-24 HRS)  CULTURE, BLOOD (SINGLE)  PROTIME-INR  APTT  LACTIC ACID, PLASMA  URINALYSIS, ROUTINE W REFLEX MICROSCOPIC  SEDIMENTATION RATE    EKG EKG Interpretation  Date/Time:  Tuesday October 30 2020 01:56:55 EDT Ventricular Rate:  97 PR Interval:    QRS Duration: 96 QT Interval:  365 QTC Calculation: 464 R Axis:   77 Text Interpretation: Sinus rhythm RSR' in V1 or V2, right VCD or RVH Confirmed by Otilio, Groleau [phone]) on 10/30/2020 3:31:15 AM   Radiology DG Chest Port 1 View  Result Date: 10/30/2020 CLINICAL DATA:  Sepsis EXAM: PORTABLE CHEST 1 VIEW COMPARISON:  Chest x-ray 01/06/2016, CT chest 01/06/2016. FINDINGS: The heart size and mediastinal contours are within normal limits. Question right mid to lower lobe increased interstitial markings. No pleural effusion. No pneumothorax. No acute osseous abnormality. IMPRESSION: Question right mid to lower lobe increased interstitial markings. Electronically Signed   By: Iven Finn M.D.   On: 10/30/2020 02:25   DG Foot 2 Views Left  Result Date: 10/30/2020 CLINICAL DATA:  Cellulitis EXAM: LEFT FOOT - 2 VIEW COMPARISON:  None. FINDINGS: Limited by positioning, technologist unable to acquire lateral view. The posterior foot is non included on the oblique views. No subluxation. Edema in the soft tissues IMPRESSION: Limited study due to positioning. Edema in the soft tissues. No acute osseous abnormality. Electronically Signed   By: Donavan Foil M.D.   On: 10/30/2020 01:25    Procedures .Critical Care Performed by: Abigail Butts, [name] Authorized by: Abigail Butts, [name]   Critical care  provider statement:    Critical care time (minutes):  45   Critical care time was exclusive of:  Separately billable procedures and treating other patients and teaching time   Critical care was necessary to treat or prevent imminent or life-threatening deterioration of the following conditions:  Sepsis   Critical care was time spent personally by me on the following activities:  Discussions with consultants, evaluation of patient's response to treatment, examination of patient, ordering and performing treatments and interventions, ordering and review of laboratory studies, ordering and review of radiographic studies, pulse oximetry, re-evaluation of patient's condition, obtaining history from patient or surrogate and review of old charts   I assumed direction of critical care for this patient from another provider in my specialty: no     Care discussed with: admitting provider       Medications Ordered in ED Medications  lactated ringers infusion (has no administration in time range)  metroNIDAZOLE (FLAGYL) IVPB 500 mg (500 mg Intravenous New Bag/Given 10/30/20 0323)  vancomycin (VANCOREADY) IVPB 2000 mg/400 mL (2,000 mg Intravenous New Bag/Given 10/30/20 0334)  lidocaine (XYLOCAINE) 2 % (with pres) injection 200 mg (has no administration in time range)  lactated ringers bolus 1,000 mL (1,000 mLs Intravenous New Bag/Given 10/30/20 0320)    And  lactated ringers bolus 1,000 mL (1,000 mLs Intravenous New Bag/Given 10/30/20 0321)    And  lactated ringers bolus 1,000 mL (1,000 mLs Intravenous New Bag/Given 10/30/20 0321)  ceFEPIme (MAXIPIME) 2 g in sodium chloride 0.9 % 100 mL IVPB (2 g Intravenous New Bag/Given 10/30/20 0322)  HYDROmorphone (DILAUDID) injection 2 mg (2 mg Intravenous Given 10/30/20 0342)    ED Course  I have reviewed the triage vital signs and the nursing notes.  Pertinent labs & imaging results that were available during my care of the patient were reviewed by me and considered in  my medical decision making (see chart for details).  Clinical Course as of 10/30/20 0418  Tue Oct 30, 2020  0305 Lactic Acid, Venous(!!): 2.2 Elevated.  Code sepsis initiated.  Broad-spectrum antibiotics given. [HM]  0359 CRP(!): 2.6 Slightly elevated [  HM]  0359 DG Chest Port 1 View right mid to lower lobe increased interstitial markings. [HM]    Clinical Course User Index [HM] Beckie Viscardi, Gwenlyn Perking   MDM Rules/Calculators/A&P                           Patient presents with left foot and ankle pain with swelling and redness.  Patient is a current IV drug user.  Significant concern for septic joint.  Considering septic joint, sepsis, endocarditis, cellulitis.   Additional history obtained:  Previous records obtained and reviewed.    Lab Tests:  I Ordered, reviewed, and interpreted labs, which included:  Sepsis order set, ESR and CRP -no leukocytosis however patient does have elevated lactic acid.  Slightly elevated C-reactive protein.  ESR is pending.  ECG:  I personally viewed and interpreted the ECG obtained.  Sinus tachycardia   Imaging Studies ordered:  I have placed order for imaging including plain film of the left ankle and chest x-ray. I personally reviewed and interpreted imaging.  No fracture or obvious joint effusion on the left ankle films.  Soft tissue swelling is noted.  Chest x-ray with some hazy patches in the right lower lobes.  Patient continues to deny shortness of breath.   ED Course:  Patient presents with left ankle pain.  Patient is a long history of IV drug use.  Highly suspicious for septic joint and possible sepsis.  Patient with difficult to control pain.  Tachycardic on arrival.  Elevated lactic acid.  Sepsis order set utilized and code sepsis initiated.  Patient given Flagyl, cefepime and vancomycin.  4:01 AM Arthrocentesis by Dr. Betsey Holiday does not produce synovial fluid.   Pt to be admitted.  The patient was discussed with and seen by Dr.  Betsey Holiday who agrees with the treatment plan.   Patient voiced understanding and agreement with the current medical evaluation and treatment plan.  Questions were answered to expressed satisfaction.    4:18 AM Discussed patient's case with hospitalist, Dr. Myna Hidalgo.  I have recommended admission and patient (and family if present) agree with this plan. Admitting physician will place admission orders.     Portions of this note were generated with Lobby[employer]. Dictation errors may occur despite best attempts at proofreading.  Final Clinical Impression(s) / ED Diagnoses Final diagnoses:  Septic arthritis of left ankle, due to unspecified organism ([place])  Sepsis without acute organ dysfunction, due to unspecified organism [facility])  IVDU (intravenous drug user)    Rx / DC Orders ED Discharge Orders    None       Melis Trochez, Gwenlyn Perking 10/30/20 0418    Orpah Greek, MD 10/30/20 [phone]

## 2020-10-30 NOTE — H&P (Signed)
History and Physical    EVERSON MOTT PQZ:[id number] DOB: 03-30-1991 DOA: 10/30/2020  PCP: Patient, No Pcp Per   Patient coming from: home   Chief Complaint: Left ankle pain, redness, swelling   HPI: JACQUEES GONGORA is a 30 y.o. male with medical history significant for IV drug abuse, now presenting to the emergency department for evaluation of pain, swelling, and redness involving the left ankle.  Patient reports that he had been in his usual state until yesterday evening when he attempted to put weight on the left foot and experienced severe pain.  He noted that the ankle was red and swollen.  He denies any other painful joints or rashes.  Reports chills over the past 1 to 2 days.  Reports that his last IV drug use was approximately 1 month ago, he plans to quit, and denies any injection into the left foot or ankle.  ED Course: Upon arrival to the ED, patient is found to be afebrile, saturating well on room air, slightly tachycardic, and stable blood pressure.  EKG features sinus rhythm.  Radiographs of the left ankle demonstrate soft tissue edema without acute osseous abnormality.  Chest x-ray raises question of increased interstitial markings involving the right mid to lower lung.  Chemistry panel and CBC unremarkable.  Lactic acid 2.2 initially.  CRP elevated to 2.6.  Blood cultures were collected in the ED, arthrocentesis was attempted but no fluid obtained, and the patient was started on empiric antibiotics.  Review of Systems:  All other systems reviewed and apart from HPI, are negative.  History reviewed. No pertinent past medical history.  Past Surgical History:  Procedure Laterality Date  . arm surgery    . LEG SURGERY      Social History:   reports that he has been smoking cigarettes. He has never used smokeless tobacco. He reports current drug use. Drug: Marijuana. He reports that he does not drink alcohol.  No Known Allergies  History reviewed. No pertinent  family history.   Prior to Admission medications   Medication Sig Start Date End Date Taking? Authorizing Provider  cloNIDine (CATAPRES) 0.1 MG tablet Take 1 tablet (0.1 mg total) by mouth 3 (three) times daily for 4 days. 01/06/20 01/10/20  Curatolo, Adam, DO  naproxen (NAPROSYN) 500 MG tablet Take 1 tablet (500 mg total) by mouth 2 (two) times daily. Patient not taking: No sig reported 11/14/18   Renne Crigler, [name]  oxyCODONE-acetaminophen (PERCOCET/ROXICET) 5-325 MG tablet Take 1 tablet by mouth every 6 (six) hours as needed for severe pain. Patient not taking: No sig reported 11/14/18   Renne Crigler, [name]    Physical Exam: Vitals:   10/30/20 0530 10/30/20 0600 10/30/20 0630 10/30/20 0700  BP: 130/87 130/85 124/80 124/79  Pulse: 89 87 77 71  Resp: 13 16 15 16   Temp:      TempSrc:      SpO2: 93% 94% 95% 95%  Weight:      Height:        Constitutional: NAD, calm  Eyes: PERTLA, lids and conjunctivae normal ENMT: Mucous membranes are moist. Posterior pharynx clear of any exudate or lesions.   Neck: normal, supple, no masses, no thyromegaly Respiratory: no wheezing, no crackles. No accessory muscle use.  Cardiovascular: S1 & S2 heard, regular rate and rhythm. No significant JVD. Abdomen: No distension, no tenderness, soft. Bowel sounds active.  Musculoskeletal: no clubbing / cyanosis. Left ankle red, warm, swollen, and exquisitely tender.   Skin: Aside from left  ankle, no significant rashes, lesions, ulcers. Warm, dry, well-perfused. Neurologic: CN 2-12 grossly intact. Sensation intact. Moving all extremities.  Psychiatric: Alert and oriented to person, place, and situation. Emotionally labile.   Labs and Imaging on Admission: I have personally reviewed following labs and imaging studies  CBC: Recent Labs  Lab 10/30/20 0158  WBC 9.9  NEUTROABS 7.1  HGB 13.4  HCT 37.7*  MCV 90.4  PLT 302   Basic Metabolic Panel: Recent Labs  Lab 10/30/20 0158  NA 135  K 3.5  CL 98   CO2 24  GLUCOSE 119*  BUN 20  CREATININE 1.08  CALCIUM 9.8   GFR: Estimated Creatinine Clearance: 112.3 mL/min (by C-G formula based on SCr of 1.08 mg/dL). Liver Function Tests: Recent Labs  Lab 10/30/20 0158  AST 23  ALT 25  ALKPHOS 89  BILITOT 1.1  PROT 8.0  ALBUMIN 4.5   No results for input(s): LIPASE, AMYLASE in the last 168 hours. No results for input(s): AMMONIA in the last 168 hours. Coagulation Profile: Recent Labs  Lab 10/30/20 0158  INR 1.1   Cardiac Enzymes: No results for input(s): CKTOTAL, CKMB, CKMBINDEX, TROPONINI in the last 168 hours. BNP (last 3 results) No results for input(s): PROBNP in the last 8760 hours. HbA1C: No results for input(s): HGBA1C in the last 72 hours. CBG: No results for input(s): GLUCAP in the last 168 hours. Lipid Profile: No results for input(s): CHOL, HDL, LDLCALC, TRIG, CHOLHDL, LDLDIRECT in the last 72 hours. Thyroid Function Tests: No results for input(s): TSH, T4TOTAL, FREET4, T3FREE, THYROIDAB in the last 72 hours. Anemia Panel: No results for input(s): VITAMINB12, FOLATE, FERRITIN, TIBC, IRON, RETICCTPCT in the last 72 hours. Urine analysis:    Component Value Date/Time   COLORURINE YELLOW 01/06/2016 0750   APPEARANCEUR CLEAR 01/06/2016 0750   LABSPEC 1.028 01/06/2016 0750   PHURINE 6.5 01/06/2016 0750   GLUCOSEU NEGATIVE 01/06/2016 0750   HGBUR NEGATIVE 01/06/2016 0750   BILIRUBINUR NEGATIVE 01/06/2016 0750   KETONESUR NEGATIVE 01/06/2016 0750   PROTEINUR NEGATIVE 01/06/2016 0750   NITRITE NEGATIVE 01/06/2016 0750   LEUKOCYTESUR NEGATIVE 01/06/2016 0750   Sepsis Labs: @LABRCNTIP (procalcitonin:4,lacticidven:4) )No results found for this or any previous visit (from the past 240 hour(s)).   Radiological Exams on Admission: DG Chest Port 1 View  Result Date: 10/30/2020 CLINICAL DATA:  Sepsis EXAM: PORTABLE CHEST 1 VIEW COMPARISON:  Chest x-ray 01/06/2016, CT chest 01/06/2016. FINDINGS: The heart size and  mediastinal contours are within normal limits. Question right mid to lower lobe increased interstitial markings. No pleural effusion. No pneumothorax. No acute osseous abnormality. IMPRESSION: Question right mid to lower lobe increased interstitial markings. Electronically Signed   By: 01/08/2016 M.D.   On: 10/30/2020 02:25   DG Foot 2 Views Left  Result Date: 10/30/2020 CLINICAL DATA:  Cellulitis EXAM: LEFT FOOT - 2 VIEW COMPARISON:  None. FINDINGS: Limited by positioning, technologist unable to acquire lateral view. The posterior foot is non included on the oblique views. No subluxation. Edema in the soft tissues IMPRESSION: Limited study due to positioning. Edema in the soft tissues. No acute osseous abnormality. Electronically Signed   By: 11/01/2020 M.D.   On: 10/30/2020 01:25    EKG: Independently reviewed. Sinus rhythm.   Assessment/Plan  1. Left ankle swelling and redness  - IV drug abuser presenting with left ankle pain, swelling, and redness with loss of PROM concerning for septic arthritis  - Arthrocentesis attempted in ED but no fluid obtained, blood  cultures were collected, and empiric antibiotics started  - Plain films with no acute osseous abnormality  - Check MRI, continue IV antibiotics, follow cultures, trend inflammatory markers   2. IV drug abuse  - Patient reports he is planning to quit  - Check viral hepatitis panel and HIV, encourage cessation    DVT prophylaxis: SCDs  Code Status: Full  Level of Care: Level of care: Med-Surg Family Communication: None present  Disposition Plan:  Patient is from: Home  Anticipated d/c is to: Home  Anticipated d/c date is: Dependent on MRI results, pain-control, and cultures  Patient currently: Pending MRI left ankle  Consults called: none  Admission status: Inpatient     Briscoe Deutscher, MD [facility]  10/30/2020, 7:07 AM

## 2020-10-30 NOTE — ED Notes (Signed)
CRITICAL VALUE STICKER  CRITICAL VALUE: Lactic Acid, 2.2   RECEIVER: Shaeleigh Graw, RN   DATE & TIME NOTIFIED:  10/30/2020 @ 0303  MESSENGER: Leeroy Bock   MD NOTIFIED:  Blinda Leatherwood   TIME OF NOTIFICATION: 0303  RESPONSE: awaiting orders

## 2020-10-30 NOTE — Progress Notes (Addendum)
Patient seen and examined personally, I reviewed the chart, history and physical and admission note, done by admitting physician this morning and agree with the same with following addendum.  Please refer to the morning admission note for more detailed plan of care.  Briefly, 30 year old male history history of IVDA admitted with left ankle pain swelling redness since 3/14 evening when he attempted to put weight on the left foot, had chills 1 to 2 days, and had last IV drug use a month ago. In the ED vitals with slight tachycardia x-ray left ankle soft tissue edema chest x-ray increased interstitial marking, labs showed lactic acidosis CRP 2.6 arthrocentesis was attempted but no fluid was obtained blood culture sent placed on empiric antibiotics MRI was ordered and patient admitted for possible left septic arthritis.  Seen this morning complains of left ankle swelling pain redness.  Denies chest pain nausea vomiting abdominal pain.  Issues: Left ankle swelling and redness and pain rule out septic arthritis: Unsuccessful arthrocentesis in the ED.  Blood culture sent, started on cefepime/vancomycin-MRI left ankle pending, monitor inflammatory markers esr 20, crp 2.6.  We will get ID consult.  Lactic acidosis likely in the setting of #1-interestingly no leukocytosis fever.  Resolved with fluids  IVDA/UDS positive for amphetamine and THC: Hepatitis panel HIV screen order  Advised to stay and complete treatment explained about risk of leaving [place] including worsening sepsis septic shock and death.

## 2020-10-30 NOTE — Progress Notes (Signed)
Pharmacy Antibiotic Note  Joel Newton is a 30 y.o. male admitted on 10/30/2020 with cellulitis.  Pharmacy has been consulted for Cefepime + Vancomycin dosing.  Plan: Cefepime 2gm IV q8h Vancomycin 2gm IV x1 then 1250mg  IV q12h to target AUC 400-550 Check Vancomycin levels at steady state Monitor renal function and cx data    Height: 5\' 9"  (175.3 cm) Weight: 90.7 kg (199 lb 15.3 oz) IBW/kg (Calculated) : 70.7  Temp (24hrs), Avg:97.6 F (36.4 C), Min:97.6 F (36.4 C), Max:97.6 F (36.4 C)  Recent Labs  Lab 10/30/20 0158 10/30/20 0531  WBC 9.9  --   CREATININE 1.08  --   LATICACIDVEN 2.2* 1.2    Estimated Creatinine Clearance: 112.3 mL/min (by C-G formula based on SCr of 1.08 mg/dL).    No Known Allergies  Antimicrobials this admission: 3/15 Cefepime >>  3/15 Vanc >>  3/15 Flagyl >>  Dose adjustments this admission:  Microbiology results: 3/15 BCx:  3/15 UCx:    Thank you for allowing pharmacy to be a part of this patient's care.  4/15 PharmD 10/30/2020 6:35 AM

## 2020-10-30 NOTE — ED Provider Notes (Signed)
Presents with pain and swelling of left ankle area. No known trauma. Physical Exam  BP 136/90   Pulse 86   Temp 97.6 F (36.4 C) (Oral)   Resp 12   Ht 5\' 9"  (1.753 m)   Wt 90.7 kg   SpO2 100%   BMI 29.53 kg/m   Physical Exam Constitutional:      Appearance: He is not ill-appearing.  HENT:     Head: Atraumatic.  Eyes:     Pupils: Pupils are equal, round, and reactive to light.  Cardiovascular:     Rate and Rhythm: Regular rhythm. Tachycardia present.  Pulmonary:     Effort: Pulmonary effort is normal.     Breath sounds: Normal breath sounds.  Musculoskeletal:     Left ankle: Deformity present. Tenderness present. Decreased range of motion.  Skin:    Findings: Erythema (medial and anterior lower leg) present.  Neurological:     General: No focal deficit present.     Mental Status: He is alert and oriented to person, place, and time.  Psychiatric:        Mood and Affect: Affect is tearful.     ED Course/Procedures   Clinical Course as of 10/30/20 0401  Tue Oct 30, 2020  0305 Lactic Acid, Venous(!!): 2.2 Elevated.  Code sepsis initiated.  Broad-spectrum antibiotics given. [HM]  0359 CRP(!): 2.6 Slightly elevated [HM]  0359 DG Chest Port 1 View right mid to lower lobe increased interstitial markings. [HM]    Clinical Course User Index [HM] [place], 0306    .Joint Aspiration/Arthrocentesis  Date/Time: 10/30/2020 4:01 AM Performed by: 11/01/2020, MD Authorized by: Gilda Crease, MD   Consent:    Consent obtained:  Verbal   Consent given by:  Patient   Risks, benefits, and alternatives were discussed: yes     Risks discussed:  Bleeding, infection and pain Universal protocol:    Procedure explained and questions answered to patient or proxy's satisfaction: yes     Relevant documents present and verified: yes     Test results available: yes     Imaging studies available: yes     Required blood products, implants, devices, and  special equipment available: yes     Site/side marked: yes     Immediately prior to procedure, a time out was called: yes     Patient identity confirmed:  Verbally with patient Location:    Location:  Ankle   Ankle:  L ankle Anesthesia:    Anesthesia method:  Local infiltration   Local anesthetic:  Lidocaine 2% w/o epi Procedure details:    Preparation: Patient was prepped and draped in usual sterile fashion     Needle gauge:  18 G   Ultrasound guidance: yes     Aspirate amount:  Antero-lateral (area with no overlying erythema)   Specimen collected: no   Post-procedure details:    Dressing:  Adhesive bandage   Procedure completion:  Tolerated well, no immediate complications    MDM  Presented with pain, swelling of left ankle.  No known trauma.  Patient is a known IV drug abuser, denies any injections in the area.  Examination reveals no range of motion of the ankle secondary to pain.  Presentation concerning for septic joint.  Recommended arthrocentesis.  Arthrocentesis performed through clear skin without any overlying erythema or signs of infection.  Needle advanced to the hub, clearly into the joint space, no synovial fluid return.      Gilda Crease  J, MD 10/30/20 [id number]

## 2020-10-30 NOTE — Consult Note (Addendum)
[facility]  Total days of antibiotics 2               Reason for Consult: left ankle cellulitis    Referring Physician: Dayna Barker  Principal Problem:   Septic arthritis (HCC)    HPI: Joel Newton is a 30 y.o. male who has reported acute onset of left ankle pain, swelling, decreased range of motion and redness. He denies any recent trauma to his left ankle. He did pull splinter from foot roughly 1 week prior to this visit. He is tearful due to the pain. Denies fever, chills, nightsweats or rigors. No N/V/diarrhea. Has hx of right ankle fracture in the past. On admit, he had elevated LA 2.2. he was empirically started on vanco/cefepime/metronidazole. 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 No pertinent family history.  Review of Systems  Constitutional: Negative for fever, chills, diaphoresis, activity change, appetite change, fatigue and unexpected weight change.  HENT: Negative for congestion, sore throat, rhinorrhea, sneezing, trouble swallowing and sinus pressure.  Eyes: Negative for photophobia and visual disturbance.  Respiratory: Negative for cough, chest tightness, shortness of breath, wheezing and stridor.  Cardiovascular: Negative for chest pain, palpitations and leg swelling.  Gastrointestinal: Negative for nausea, vomiting, abdominal pain, diarrhea, constipation, blood in stool, abdominal distention and anal bleeding.  Genitourinary: Negative for dysuria, hematuria, flank pain and difficulty  urinating.  Musculoskeletal: +left ankle pain and swelling. Negative for myalgias, back pain, joint swelling, arthralgias and gait problem.  Skin: Negative for color change, pallor, rash and wound.  Neurological: Negative for dizziness, tremors, weakness and light-headedness.  Hematological: Negative for adenopathy. Does not bruise/bleed easily.  Psychiatric/Behavioral: Negative for behavioral problems, confusion, sleep disturbance, dysphoric mood, decreased concentration and agitation.     OBJECTIVE: Temp:  [97.6 F (36.4 C)-98.2 F (36.8 C)] 98.2 F (36.8 C) (03/15 1551) Pulse Rate:  [62-104] 74 (03/15 1551) Resp:  [11-22] 11 (03/15 1333) BP: (116-163)/(74-98) 135/90 (03/15 1551) SpO2:  [93 %-100 %] 98 % (03/15 1551) Weight:  [90.7 kg-97.7 kg] 97.7 kg (03/15 1550) Physical Exam  Constitutional: He is oriented to person, place, and time. He appears well-developed and well-nourished. No distress.  HENT:  Mouth/Throat: Oropharynx is clear and moist. No oropharyngeal exudate.  Cardiovascular: Normal rate, regular rhythm and normal heart sounds. Exam reveals no gallop and no friction rub.  No murmur heard.  Pulmonary/Chest: Effort normal and breath sounds normal. No respiratory distress. He has no wheezes.  Abdominal: Soft. Bowel sounds are normal. He exhibits no distension. There is no tenderness. +left inguinal LN tenderness Lymphadenopathy:  He has no cervical adenopathy.  Ext: left ankle swelling, erythema. Decreased range of motion. Neurological: He is alert and oriented to person, place, and time.  Skin: Skin is warm and dry. No rash noted. No erythema.  Psychiatric: He has a normal mood and affect. His behavior is normal.    LABS: Results for orders placed or performed during the hospital encounter of 10/30/20 (from the past  48 hour(s))  Lactic acid, plasma     Status: Abnormal   Collection Time: 10/30/20  1:58 AM  Result Value Ref Range   Lactic Acid, Venous 2.2 (HH) 0.5  - 1.9 mmol/L    Comment: CRITICAL RESULT CALLED TO, READ BACK BY AND VERIFIED WITH: FELICIA, RN @ 0303 ON 10/30/20 Riesa Pope Performed at [facility], [address] [place], [place] [address]   Comprehensive metabolic panel     Status: Abnormal   Collection Time: 10/30/20  1:58 AM  Result Value Ref Range   Sodium 135 135 - 145 mmol/L   Potassium 3.5 3.5 - 5.1 mmol/L   Chloride 98 98 - 111 mmol/L   CO2 24 22 - 32 mmol/L   Glucose, Bld 119 (H) 70 - 99 mg/dL    Comment: Glucose reference range applies only to samples taken after fasting for at least 8 hours.   BUN 20 6 - 20 mg/dL   Creatinine, Ser 0.63 0.61 - 1.24 mg/dL   Calcium 9.8 8.9 - 01.6 mg/dL   Total Protein 8.0 6.5 - 8.1 g/dL   Albumin 4.5 3.5 - 5.0 g/dL   AST 23 15 - 41 U/L   ALT 25 0 - 44 U/L   Alkaline Phosphatase 89 38 - 126 U/L   Total Bilirubin 1.1 0.3 - 1.2 mg/dL   GFR, Estimated >01 >09 mL/min    Comment: (NOTE) Calculated using the CKD-EPI Creatinine Equation (2021)    Anion gap 13 5 - 15    Comment: Performed at [facility], [address], [place], [place] [address]  CBC WITH DIFFERENTIAL     Status: Abnormal   Collection Time: 10/30/20  1:58 AM  Result Value Ref Range   WBC 9.9 4.0 - 10.5 K/uL   RBC 4.17 (L) 4.22 - 5.81 MIL/uL   Hemoglobin 13.4 13.0 - 17.0 g/dL   HCT 73.2 (L) 20.2 - 54.2 %   MCV 90.4 80.0 - 100.0 fL   MCH 32.1 26.0 - 34.0 pg   MCHC 35.5 30.0 - 36.0 g/dL   RDW 70.6 23.7 - 62.8 %   Platelets 302 150 - 400 K/uL   nRBC 0.0 0.0 - 0.2 %   Neutrophils Relative % 71 %   Neutro Abs 7.1 1.7 - 7.7 K/uL   Lymphocytes Relative 16 %   Lymphs Abs 1.6 0.7 - 4.0 K/uL   Monocytes Relative 10 %   Monocytes Absolute 1.0 0.1 - 1.0 K/uL   Eosinophils Relative 1 %   Eosinophils Absolute 0.1 0.0 - 0.5 K/uL   Basophils Relative 1 %   Basophils Absolute 0.1 0.0 - 0.1 K/uL   Immature Granulocytes 1 %   Abs Immature Granulocytes 0.05 0.00 - 0.07 K/uL    Comment:  Performed at [facility], [address], [place], [place] [address]  Protime-INR     Status: None   Collection Time: 10/30/20  1:58 AM  Result Value Ref Range   Prothrombin Time 14.0 11.4 - 15.2 seconds   INR 1.1 0.8 - 1.2    Comment: (NOTE) INR goal varies based on device and disease states. Performed at [facility], [address], [place], [place] [address]   APTT     Status: None   Collection Time: 10/30/20  1:58 AM  Result Value Ref Range   aPTT 29 24 - 36 seconds    Comment: Performed at [facility], [address] [place], [place] [address]  Blood culture (routine single)     Status: None (Preliminary result)   Collection Time: 10/30/20  1:58 AM   Specimen: BLOOD  Result Value Ref Range   Specimen Description      BLOOD SITE NOT SPECIFIED Performed at [facility], [address] [place], [place] [address]    Special Requests      BOTTLES DRAWN AEROBIC AND ANAEROBIC Blood Culture adequate volume Performed at [facility], [address] [place], [place] [address]    Culture PENDING    Report Status PENDING   SARS CORONAVIRUS 2 (TAT 6-24 HRS) Nasopharyngeal Nasopharyngeal Swab     Status: None   Collection Time: 10/30/20  2:10 AM   Specimen: Nasopharyngeal Swab  Result Value Ref Range   SARS Coronavirus 2 NEGATIVE NEGATIVE    Comment: (NOTE) SARS-CoV-2 target nucleic acids are NOT DETECTED.  The SARS-CoV-2 RNA is generally detectable in upper and lower respiratory specimens during the acute phase of infection. Negative results do not preclude SARS-CoV-2 infection, do not rule out co-infections with other pathogens, and should not be used as the sole basis for treatment or other patient management decisions. Negative results must be combined with clinical observations, patient history, and epidemiological information. The expected result is Negative.  Fact Sheet for  Patients: HairSlick.nohttps://www.fda.gov/media/138098/download  Fact Sheet for Healthcare Providers: quierodirigir.comhttps://www.fda.gov/media/138095/download  This test is not yet approved or cleared by the [place] FDA and  has been authorized for detection and/or diagnosis of SARS-CoV-2 by FDA under an Emergency Use Authorization (EUA). This EUA will remain  in effect (meaning this test can be used) for the duration of the COVID-19 declaration under Se ction 564(b)(1) of the Act, 21 U.S.C. section 360bbb-3(b)(1), unless the authorization is terminated or revoked sooner.  Performed at [facility], [address] [place], [place] [address]   Sedimentation rate     Status: Abnormal   Collection Time: 10/30/20  2:10 AM  Result Value Ref Range   Sed Rate 20 (H) 0 - 16 mm/hr    Comment: Performed at [facility], [address] [place], [place] [address]  C-reactive protein     Status: Abnormal   Collection Time: 10/30/20  2:10 AM  Result Value Ref Range   CRP 2.6 (H) <1.0 mg/dL    Comment: Performed at [facility], [address] [place], [place] [address]  Lactic acid, plasma     Status: None   Collection Time: 10/30/20  5:31 AM  Result Value Ref Range   Lactic Acid, Venous 1.2 0.5 - 1.9 mmol/L    Comment: Performed at [facility], [address] [place], [place] [address]  Urinalysis, Routine w reflex microscopic Urine, Clean Catch     Status: Abnormal   Collection Time: 10/30/20  6:51 AM  Result Value Ref Range   Color, Urine YELLOW YELLOW   APPearance CLEAR CLEAR   Specific Gravity, Urine 1.014 1.005 - 1.030   pH 8.0 5.0 - 8.0   Glucose, UA NEGATIVE NEGATIVE mg/dL   Hgb urine dipstick NEGATIVE NEGATIVE   Bilirubin Urine NEGATIVE NEGATIVE   Ketones, ur 20 (A) NEGATIVE mg/dL   Protein, ur NEGATIVE NEGATIVE mg/dL   Nitrite NEGATIVE NEGATIVE   Leukocytes,Ua NEGATIVE NEGATIVE   RBC / HPF 0-5 0 - 5 RBC/hpf   WBC, UA 0-5 0 - 5  WBC/hpf   Bacteria, UA RARE (A) NONE SEEN  Squamous Epithelial / LPF 0-5 0 - 5   Mucus PRESENT     Comment: Performed at [facility], [address] [place], [place] [address]  Urine rapid drug screen (hosp performed)     Status: Abnormal   Collection Time: 10/30/20  6:51 AM  Result Value Ref Range   Opiates NONE DETECTED NONE DETECTED   Cocaine NONE DETECTED NONE DETECTED   Benzodiazepines NONE DETECTED NONE DETECTED   Amphetamines POSITIVE (A) NONE DETECTED   Tetrahydrocannabinol POSITIVE (A) NONE DETECTED   Barbiturates NONE DETECTED NONE DETECTED    Comment: (NOTE) DRUG SCREEN FOR MEDICAL PURPOSES ONLY.  IF CONFIRMATION IS NEEDED FOR ANY PURPOSE, NOTIFY LAB WITHIN 5 DAYS.  LOWEST DETECTABLE LIMITS FOR URINE DRUG SCREEN Drug Class                     Cutoff (ng/mL) Amphetamine and metabolites    1000 Barbiturate and metabolites    200 Benzodiazepine                 200 Tricyclics and metabolites     300 Opiates and metabolites        300 Cocaine and metabolites        300 THC                            50 Performed at [facility], [address], [place], [place] [address]   Hepatitis panel, acute     Status: None   Collection Time: 10/30/20  8:30 AM  Result Value Ref Range   Hepatitis B Surface Ag NON REACTIVE NON REACTIVE   HCV Ab NON REACTIVE NON REACTIVE    Comment: (NOTE) Nonreactive HCV antibody screen is consistent with no HCV infections,  unless recent infection is suspected or other evidence exists to indicate HCV infection.     Hep A IgM NON REACTIVE NON REACTIVE   Hep B C IgM NON REACTIVE NON REACTIVE    Comment: Performed at [facility], [address] [place], [place] [address]  Uric acid     Status: None   Collection Time: 10/30/20  8:30 AM  Result Value Ref Range   Uric Acid, Serum 5.8 3.7 - 8.6 mg/dL    Comment: Performed at [facility], [address] [place], [place]  [address]    MICRO: reviewed IMAGING: MR ANKLE LEFT W WO CONTRAST  Result Date: 10/30/2020 CLINICAL DATA:  Onset left ankle pain, redness and limited range of motion yesterday. History of IV drug abuse. EXAM: MRI OF THE LEFT ANKLE WITHOUT AND WITH CONTRAST TECHNIQUE: Multiplanar, multisequence MR imaging of the left ankle was performed both before and after administration of intravenous contrast. CONTRAST:  9 mL GADAVIST IV SOLN COMPARISON:  Plain films left foot today. FINDINGS: Bones/Joint/Cartilage There is no marrow edema or enhancement to suggest osteomyelitis. No fracture, stress change or worrisome lesion is identified. No joint effusion. Ligaments Intact. Muscles and Tendons Intact and normal in appearance.  No tear evidence tenosynovitis. Soft tissues There is subcutaneous edema and enhancement about the ankle consistent with cellulitis. Negative for abscess. IMPRESSION: Findings compatible with cellulitis about the ankle. Negative for osteomyelitis, septic joint or myositis. Electronically Signed   By: Drusilla Kanner M.D.   On: 10/30/2020 11:42   DG Chest Port 1 View  Result Date: 10/30/2020 CLINICAL DATA:  Sepsis EXAM: PORTABLE CHEST 1 VIEW  COMPARISON:  Chest x-ray 01/06/2016, CT chest 01/06/2016. FINDINGS: The heart size and mediastinal contours are within normal limits. Question right mid to lower lobe increased interstitial markings. No pleural effusion. No pneumothorax. No acute osseous abnormality. IMPRESSION: Question right mid to lower lobe increased interstitial markings. Electronically Signed   By: Tish Frederickson M.D.   On: 10/30/2020 02:25   DG Foot 2 Views Left  Result Date: 10/30/2020 CLINICAL DATA:  Cellulitis EXAM: LEFT FOOT - 2 VIEW COMPARISON:  None. FINDINGS: Limited by positioning, technologist unable to acquire lateral view. The posterior foot is non included on the oblique views. No subluxation. Edema in the soft tissues IMPRESSION: Limited study due to positioning.  Edema in the soft tissues. No acute osseous abnormality. Electronically Signed   By: Jasmine Pang M.D.   On: 10/30/2020 01:25    Assessment/Plan: 29yo M with acute left ankle swelling with cellulitis. Acute onset is concerning for group a strep/streptococcal infection.  - recommend to do vancomycin plus cefazolin for the time being - await blood cx to see if has disseminated infection  Pain management = defer to primary team   History of remote ivdu = will check hep c and hiv. Drug screen shows amphetamines and THC.

## 2020-10-30 NOTE — ED Triage Notes (Signed)
Pt to ED by EMS from home with left foot pain and swelling from possible insect bite. Pt arrives inconsolably crying. Doesn't recall any specific incident where he can remember getting bit. Arrives A+O, VSS. PSM intact.

## 2020-10-30 NOTE — ED Notes (Addendum)
Tried to obtain Covid swab. Patient would only allow the entrance of his nostril to be lightly swabbed. Patient states he has had a class and been trained on how to provide COVID swabs and they do not need to go "deep". Patient adds he test himself at home all the time and he knows what he is doing.

## 2020-10-30 NOTE — Progress Notes (Signed)
Dr. Blinda Leatherwood aware of earlier lactic acid, VSS, Abx's and fluids have been given appropriately

## 2020-10-30 NOTE — Progress Notes (Signed)
A consult was received from an ED physician for Vancomycin + Cefepime per pharmacy dosing.  The patient's profile has been reviewed for ht/wt/allergies/indication/available labs.   A one time order has been placed for Cefepime 2gm & Vancomycin 2gm IV.  Further antibiotics/pharmacy consults should be ordered by admitting physician if indicated.                       Thank you, Junita Push 10/30/2020  3:07 AM

## 2020-10-30 NOTE — ED Notes (Signed)
Pt. Given ice pack per RN, Milagros Loll.

## 2020-10-31 DIAGNOSIS — L03116 Cellulitis of left lower limb: Secondary | ICD-10-CM | POA: Diagnosis not present

## 2020-10-31 DIAGNOSIS — E669 Obesity, unspecified: Secondary | ICD-10-CM | POA: Diagnosis present

## 2020-10-31 LAB — URINE CULTURE: Culture: NO GROWTH

## 2020-10-31 MED ORDER — KETOROLAC TROMETHAMINE 30 MG/ML IJ SOLN: 30.0000 mg | Freq: Four times a day (QID) | INTRAMUSCULAR | Status: DC | PRN

## 2020-10-31 MED ORDER — SODIUM CHLORIDE 0.9 % IV SOLN: INTRAVENOUS | Status: DC

## 2020-10-31 MED ORDER — VANCOMYCIN HCL 1500 MG/300ML IV SOLN
1500.0000 mg | Freq: Two times a day (BID) | INTRAVENOUS | Status: DC
  Administered 2020-10-31 – 2020-11-01 (×3): 1500 mg via INTRAVENOUS
  Filled 2020-10-31 (×3): qty 300

## 2020-10-31 NOTE — Progress Notes (Addendum)
PROGRESS NOTE    Joel Newton  EXH:[id number] DOB: [birth year] DOA: 10/30/2020 PCP: Patient, No Pcp Per   Chief Complaint  Patient presents with  . Foot Pain  Brief Narrative: 30 year old male history history of IVDA admitted with left ankle pain swelling redness since 3/14 evening when he attempted to put weight on the left foot, had chills 1 to 2 days, and had last IV drug use a month ago. In the ED vitals with slight tachycardia x-ray left ankle soft tissue edema chest x-ray increased interstitial marking, labs showed lactic acidosis CRP 2.6 arthrocentesis was attempted but no fluid was obtained blood culture sent placed on empiric antibiotics MRI was ordered and patient admitted for possible left septic arthritis. Patient was admitted on IV antibiotics.  MRI showed cellulitis above the ankle no osteomyelitis septic joint or myositis.   Subjective: Seen and examined this morning.  Complains of ongoing left foot ankle pain feels somewhat better from before.  Afebrile overnight.  Assessment & Plan: Left ankle swelling with cellulitis MRI negative for osteomyelitis septic joint or myositis.  Due to acute onset concern for staph/streptococcal infection, appreciate ID input on board, continue with vancomycin plus cefazolin for now.  Continue pain management with oral hydrocodone, Toradol Tylenol, minimize narcotic use discussed with the patient.  Follow-up blood cultures, no growth so far.Discussed about not leaving AGAINST MEDICAL ADVICE  History of remote IV drug abuse the patient reports she has not used this since over a month last admission for IVDA withdrawal was May 2021.  UDS positive for amphetamine and THC.  Lactic acidosis likely in the setting of cellulitis dehydration patient has not had any fever leukocytosis.  Is resolved with fluids.  Prediabetes w/ hba1c 6.1- dietary education.dietiatian consult.  Morbid obesity BMI 30.9 will benefit with weight loss and PCP  follow-up.  Diet Order            Diet regular Room service appropriate? Yes; Fluid consistency: Thin  Diet effective now                 Patient's Body mass index is 30.91 kg/m.  DVT prophylaxis: SCDs Start: 10/30/20 3810 Code Status:   Code Status: Full Code  Family Communication: plan of care discussed with patient at bedside.  Status is: Inpatient Remains inpatient appropriate because:IV treatments appropriate due to intensity of illness or inability to take PO and Inpatient level of care appropriate due to severity of illness  Dispo: The patient is from: Home              Anticipated d/c is to: Home  2-3 days              Patient currently is not medically stable to d/c.   Difficult to place patient No       Unresulted Labs (From admission, onward)          Start     Ordered   10/31/20 0500  C-reactive protein  Daily,   R      10/30/20 0624   10/31/20 0500  Sedimentation rate  Daily,   R      10/30/20 0624        Medications reviewed:  Scheduled Meds: Continuous Infusions: . sodium chloride 50 mL/hr at 10/31/20 1040  .  ceFAZolin (ANCEF) IV 2 g (10/31/20 0504)  . vancomycin 1,500 mg (10/31/20 1040)    Consultants:see note  Procedures:see note  Antimicrobials: Anti-infectives (From admission, onward)   Start  Dose/Rate Route Frequency Ordered Stop   10/31/20 1030  vancomycin (VANCOREADY) IVPB 1500 mg/300 mL        1,500 mg 150 mL/hr over 120 Minutes Intravenous Every 12 hours 10/31/20 0922     10/30/20 2000  ceFAZolin (ANCEF) IVPB 2g/100 mL premix        2 g 200 mL/hr over 30 Minutes Intravenous Every 8 hours 10/30/20 1500     10/30/20 1200  ceFEPIme (MAXIPIME) 2 g in sodium chloride 0.9 % 100 mL IVPB  Status:  Discontinued        2 g 200 mL/hr over 30 Minutes Intravenous Every 8 hours 10/30/20 0643 10/30/20 1500   10/30/20 1000  vancomycin (VANCOREADY) IVPB 1250 mg/250 mL  Status:  Discontinued        1,250 mg 166.7 mL/hr over 90 Minutes  Intravenous Every 12 hours 10/30/20 0643 10/30/20 1500   10/30/20 0315  ceFEPIme (MAXIPIME) 2 g in sodium chloride 0.9 % 100 mL IVPB        2 g 200 mL/hr over 30 Minutes Intravenous  Once 10/30/20 0305 10/30/20 0444   10/30/20 0315  metroNIDAZOLE (FLAGYL) IVPB 500 mg        500 mg 100 mL/hr over 60 Minutes Intravenous  Once 10/30/20 0305 10/30/20 0423   10/30/20 0315  vancomycin (VANCOCIN) IVPB 1000 mg/200 mL premix  Status:  Discontinued        1,000 mg 200 mL/hr over 60 Minutes Intravenous  Once 10/30/20 0305 10/30/20 0307   10/30/20 0315  vancomycin (VANCOREADY) IVPB 2000 mg/400 mL        2,000 mg 200 mL/hr over 120 Minutes Intravenous  Once 10/30/20 0307 10/30/20 0534     Culture/Microbiology    Component Value Date/Time   SDES  10/30/2020 0651    IN/OUT CATH URINE Performed at [facility], [address] [place], [place] [address]    SPECREQUEST  10/30/2020 [id number]    NONE Performed at [facility], [address] [place], [place] [address]    CULT  10/30/2020 [phone]    NO GROWTH Performed at [facility], [address] [place], [place] [address]    REPTSTATUS 10/31/2020 FINAL 10/30/2020 [id number]    Other culture-see note  Objective: Vitals: Today's Vitals   10/31/20 0011 10/31/20 0102 10/31/20 0457 10/31/20 0547  BP: 126/90  136/82   Pulse: 84  78   Resp: 18  16   Temp: 98 F (36.7 C)  97.6 F (36.4 C)   TempSrc: Oral  Oral   SpO2: 97%  96%   Weight:      Height:      PainSc:  Asleep  5     Intake/Output Summary (Last 24 hours) at 10/31/2020 1054 Last data filed at 10/31/2020 0500 Gross per 24 hour  Intake 2037.43 ml  Output 1200 ml  Net 837.43 ml   Filed Weights   10/30/20 0051 10/30/20 1550  Weight: 90.7 kg 97.7 kg   Weight change: 7 kg  Intake/Output from previous day: 03/15 0701 - 03/16 0700 In: 2037.4 [I.V.:1687.4; IV Piggyback:350] Out: 1200 [Urine:1200] Intake/Output this shift: No intake/output  data recorded. Filed Weights   10/30/20 0051 10/30/20 1550  Weight: 90.7 kg 97.7 kg    Examination: General exam: AAOx3 ,NAD, weak appearing. HEENT:Oral mucosa moist, Ear/Nose WNL grossly,dentition normal. Respiratory system: bilaterally diminished,no use of accessory muscle, non tender. Cardiovascular system: S1 & S2 +, regular, No JVD.  No murmur. Gastrointestinal  system: Abdomen soft, NT,ND, BS+. Nervous System:Alert, awake, moving extremities and grossly nonfocal Extremities: Left ankle foot red swollen tender, distal peripheral pulses palpable.  Skin: No rashes,no icterus. MSK: Normal muscle bulk,tone, power  Data Reviewed: I have personally reviewed following labs and imaging studies CBC: Recent Labs  Lab 10/30/20 0158  WBC 9.9  NEUTROABS 7.1  HGB 13.4  HCT 37.7*  MCV 90.4  PLT 302   Basic Metabolic Panel: Recent Labs  Lab 10/30/20 0158  NA 135  K 3.5  CL 98  CO2 24  GLUCOSE 119*  BUN 20  CREATININE 1.08  CALCIUM 9.8   GFR: Estimated Creatinine Clearance: 118.3 mL/min (by C-G formula based on SCr of 1.08 mg/dL). Liver Function Tests: Recent Labs  Lab 10/30/20 0158  AST 23  ALT 25  ALKPHOS 89  BILITOT 1.1  PROT 8.0  ALBUMIN 4.5   No results for input(s): LIPASE, AMYLASE in the last 168 hours. No results for input(s): AMMONIA in the last 168 hours. Coagulation Profile: Recent Labs  Lab 10/30/20 0158  INR 1.1   Cardiac Enzymes: No results for input(s): CKTOTAL, CKMB, CKMBINDEX, TROPONINI in the last 168 hours. BNP (last 3 results) No results for input(s): PROBNP in the last 8760 hours. HbA1C: Recent Labs    10/30/20 0210  HGBA1C 6.1*   CBG: No results for input(s): GLUCAP in the last 168 hours. Lipid Profile: No results for input(s): CHOL, HDL, LDLCALC, TRIG, CHOLHDL, LDLDIRECT in the last 72 hours. Thyroid Function Tests: No results for input(s): TSH, T4TOTAL, FREET4, T3FREE, THYROIDAB in the last 72 hours. Anemia Panel: No results  for input(s): VITAMINB12, FOLATE, FERRITIN, TIBC, IRON, RETICCTPCT in the last 72 hours. Sepsis Labs: Recent Labs  Lab 10/30/20 0158 10/30/20 0531  LATICACIDVEN 2.2* 1.2    Recent Results (from the past 240 hour(s))  Blood culture (routine single)     Status: None (Preliminary result)   Collection Time: 10/30/20  1:58 AM   Specimen: BLOOD  Result Value Ref Range Status   Specimen Description   Final    BLOOD SITE NOT SPECIFIED Performed at [facility], [address] [place], [place] [address]    Special Requests   Final    BOTTLES DRAWN AEROBIC AND ANAEROBIC Blood Culture adequate volume Performed at [facility], [address] [place], [place] [address]    Culture   Final    NO GROWTH 1 DAY Performed at [facility], [address] [place], [place] [address]    Report Status PENDING  Incomplete  SARS CORONAVIRUS 2 (TAT 6-24 HRS) Nasopharyngeal Nasopharyngeal Swab     Status: None   Collection Time: 10/30/20  2:10 AM   Specimen: Nasopharyngeal Swab  Result Value Ref Range Status   SARS Coronavirus 2 NEGATIVE NEGATIVE Final    Comment: (NOTE) SARS-CoV-2 target nucleic acids are NOT DETECTED.  The SARS-CoV-2 RNA is generally detectable in upper and lower respiratory specimens during the acute phase of infection. Negative results do not preclude SARS-CoV-2 infection, do not rule out co-infections with other pathogens, and should not be used as the sole basis for treatment or other patient management decisions. Negative results must be combined with clinical observations, patient history, and epidemiological information. The expected result is Negative.  Fact Sheet for Patients: HairSlick.no  Fact Sheet for Healthcare Providers: quierodirigir.com  This test is not yet approved or cleared by the [place] FDA and  has been authorized for detection and/or diagnosis  of SARS-CoV-2  by FDA under an Emergency Use Authorization (EUA). This EUA will remain  in effect (meaning this test can be used) for the duration of the COVID-19 declaration under Se ction 564(b)(1) of the Act, 21 U.S.C. section 360bbb-3(b)(1), unless the authorization is terminated or revoked sooner.  Performed at [facility], [address], [place], [place] [address]   Culture, blood (single)     Status: None (Preliminary result)   Collection Time: 10/30/20  3:25 AM   Specimen: BLOOD  Result Value Ref Range Status   Specimen Description   Final    BLOOD LEFT ANTECUBITAL Performed at [facility], [address], [place], [place] [address]    Special Requests   Final    BOTTLES DRAWN AEROBIC AND ANAEROBIC Blood Culture adequate volume Performed at [facility], [address], [place], [place] [address]    Culture   Final    NO GROWTH 1 DAY Performed at [facility], [address] [place], [place] [address]    Report Status PENDING  Incomplete  Urine culture     Status: None   Collection Time: 10/30/20  6:51 AM   Specimen: In/Out Cath Urine  Result Value Ref Range Status   Specimen Description   Final    IN/OUT CATH URINE Performed at [facility], [address] [place], [place] [address]    Special Requests   Final    NONE Performed at [facility], [address] [place], [place] [address]    Culture   Final    NO GROWTH Performed at [facility], [address], [place], [place] [address]    Report Status 10/31/2020 FINAL  Final     Radiology Studies: MR ANKLE LEFT W WO CONTRAST  Result Date: 10/30/2020 CLINICAL DATA:  Onset left ankle pain, redness and limited range of motion yesterday. History of IV drug abuse. EXAM: MRI OF THE LEFT ANKLE WITHOUT AND WITH CONTRAST TECHNIQUE: Multiplanar, multisequence MR imaging of the left ankle was performed both before and after  administration of intravenous contrast. CONTRAST:  9 mL GADAVIST IV SOLN COMPARISON:  Plain films left foot today. FINDINGS: Bones/Joint/Cartilage There is no marrow edema or enhancement to suggest osteomyelitis. No fracture, stress change or worrisome lesion is identified. No joint effusion. Ligaments Intact. Muscles and Tendons Intact and normal in appearance.  No tear evidence tenosynovitis. Soft tissues There is subcutaneous edema and enhancement about the ankle consistent with cellulitis. Negative for abscess. IMPRESSION: Findings compatible with cellulitis about the ankle. Negative for osteomyelitis, septic joint or myositis. Electronically Signed   By: Drusilla Kanner M.D.   On: 10/30/2020 11:42   DG Chest Port 1 View  Result Date: 10/30/2020 CLINICAL DATA:  Sepsis EXAM: PORTABLE CHEST 1 VIEW COMPARISON:  Chest x-ray 01/06/2016, CT chest 01/06/2016. FINDINGS: The heart size and mediastinal contours are within normal limits. Question right mid to lower lobe increased interstitial markings. No pleural effusion. No pneumothorax. No acute osseous abnormality. IMPRESSION: Question right mid to lower lobe increased interstitial markings. Electronically Signed   By: Tish Frederickson M.D.   On: 10/30/2020 02:25   DG Foot 2 Views Left  Result Date: 10/30/2020 CLINICAL DATA:  Cellulitis EXAM: LEFT FOOT - 2 VIEW COMPARISON:  None. FINDINGS: Limited by positioning, technologist unable to acquire lateral view. The posterior foot is non included on the oblique views. No subluxation. Edema in the soft tissues IMPRESSION:  Limited study due to positioning. Edema in the soft tissues. No acute osseous abnormality. Electronically Signed   By: Jasmine Pang M.D.   On: 10/30/2020 01:25     LOS: 1 day   Lanae Boast, MD [facility]  10/31/2020, 10:54 AM

## 2020-10-31 NOTE — Progress Notes (Signed)
[facility]    Date of Admission:  10/30/2020   Total days of antibiotics 2          ID: SALEM Newton is a 30 y.o. male with left ankle cellulitis Principal Problem:   Cellulitis of left ankle Active Problems:   Obesity (BMI 30.0-34.9)    Subjective: Having some improvement in left ankle swelling and pain. Still guarded with examination, not moving/bending or rotating his left leg    Objective: Vital signs in last 24 hours: Temp:  [97.6 F (36.4 C)-98.3 F (36.8 C)] 98.1 F (36.7 C) (03/16 1400) Pulse Rate:  [73-91] 73 (03/16 1400) Resp:  [16-18] 16 (03/16 1400) BP: (119-145)/(73-90) 145/88 (03/16 1400) SpO2:  [96 %-98 %] 98 % (03/16 1400) Physical Exam  Constitutional: He is oriented to person, place, and time. He appears well-developed and well-nourished. No distress.  HENT:  Mouth/Throat: Oropharynx is clear and moist. No oropharyngeal exudate.  Cardiovascular: Normal rate, regular rhythm and normal heart sounds. Exam reveals no gallop and no friction rub.  No murmur heard.  Pulmonary/Chest: Effort normal and breath sounds normal. No respiratory distress. He has no wheezes.  Abdominal: Soft. Bowel sounds are normal. He exhibits no distension. There is no tenderness.  Lymphadenopathy:  He has no cervical adenopathy.  Ext: left ankle swelling slightly improved, less erythema. Neurological: He is alert and oriented to person, place, and time.  Skin: Skin is warm and dry. No rash noted. No erythema.  Psychiatric: He has a normal mood and affect. His behavior is normal.     Lab Results Recent Labs    10/30/20 0158  WBC 9.9  HGB 13.4  HCT 37.7*  NA 135  K 3.5  CL 98  CO2 24  BUN 20  CREATININE 1.08   Liver Panel Recent Labs    10/30/20 0158  PROT 8.0  ALBUMIN 4.5  AST 23  ALT 25  ALKPHOS 89  BILITOT 1.1   Sedimentation Rate Recent Labs    10/30/20 0210  ESRSEDRATE 20*   C-Reactive Protein Recent Labs     10/30/20 0210  CRP 2.6*    Microbiology: 3/15 blood cx ngtd Studies/Results: MR ANKLE LEFT W WO CONTRAST  Result Date: 10/30/2020 CLINICAL DATA:  Onset left ankle pain, redness and limited range of motion yesterday. History of IV drug abuse. EXAM: MRI OF THE LEFT ANKLE WITHOUT AND WITH CONTRAST TECHNIQUE: Multiplanar, multisequence MR imaging of the left ankle was performed both before and after administration of intravenous contrast. CONTRAST:  9 mL GADAVIST IV SOLN COMPARISON:  Plain films left foot today. FINDINGS: Bones/Joint/Cartilage There is no marrow edema or enhancement to suggest osteomyelitis. No fracture, stress change or worrisome lesion is identified. No joint effusion. Ligaments Intact. Muscles and Tendons Intact and normal in appearance.  No tear evidence tenosynovitis. Soft tissues There is subcutaneous edema and enhancement about the ankle consistent with cellulitis. Negative for abscess. IMPRESSION: Findings compatible with cellulitis about the ankle. Negative for osteomyelitis, septic joint or myositis. Electronically Signed   By: Drusilla Kanner M.D.   On: 10/30/2020 11:42   DG Chest Port 1 View  Result Date: 10/30/2020 CLINICAL DATA:  Sepsis EXAM: PORTABLE CHEST 1 VIEW COMPARISON:  Chest x-ray 01/06/2016, CT chest 01/06/2016. FINDINGS: The heart size and mediastinal contours are within normal limits. Question right mid to lower lobe increased interstitial markings. No pleural effusion. No pneumothorax. No acute osseous abnormality. IMPRESSION: Question right mid to lower lobe increased  interstitial markings. Electronically Signed   By: Tish Frederickson M.D.   On: 10/30/2020 02:25   DG Foot 2 Views Left  Result Date: 10/30/2020 CLINICAL DATA:  Cellulitis EXAM: LEFT FOOT - 2 VIEW COMPARISON:  None. FINDINGS: Limited by positioning, technologist unable to acquire lateral view. The posterior foot is non included on the oblique views. No subluxation. Edema in the soft tissues  IMPRESSION: Limited study due to positioning. Edema in the soft tissues. No acute osseous abnormality. Electronically Signed   By: Jasmine Pang M.D.   On: 10/30/2020 01:25     Assessment/Plan: Probable cellulitis of left ankle = continue on vancomycin and cefazolin for the time being. Await to see if blood cx turn positive over the next 24hr  Pain management = toradol appears to help, discussed with patient to try to move his leg as he tolerates.  [facility] Cell: [phone] Pager: [phone]  10/31/2020, 5:58 PM

## 2020-10-31 NOTE — Progress Notes (Signed)
Pharmacy Antibiotic Note  Joel Newton is a 30 y.o. male admitted on 10/30/2020 with left ankle cellulitis.  Pharmacy has been consulted for Vancomycin dosing. Patient had an acute onset of cellulitis concerning for strep or staph infection. Will cover Strep and MRSA with Cefazolin and Vancomycin. SCr -1.08 with CrCl > 100 ml/min.  Noted that patient got a load of 2 gm yesterday and a dose of 1250 mg about 4 hours apart. We will start q 12 dosing this morning.   Plan: Vancomycin 1500 mg every 12 hours  Predicted AUC 469 with Scr 1.08 Cefazolin 2 gm IV q 8 hours  Check Vancomycin levels at steady state Monitor renal function and cx data    Height: 5\' 10"  (177.8 cm) Weight: 97.7 kg (215 lb 6.2 oz) IBW/kg (Calculated) : 73  Temp (24hrs), Avg:98 F (36.7 C), Min:97.6 F (36.4 C), Max:98.3 F (36.8 C)  Recent Labs  Lab 10/30/20 0158 10/30/20 0531  WBC 9.9  --   CREATININE 1.08  --   LATICACIDVEN 2.2* 1.2    Estimated Creatinine Clearance: 118.3 mL/min (by C-G formula based on SCr of 1.08 mg/dL).    No Known Allergies  Antimicrobials this admission: 3/15 Cefepime >> 3/15 3/15 Vanc >>  3/15 Flagyl >>3/15 3/15 Cefazolin>  Dose adjustments this admission:  Microbiology results: 3/15 BCx:  3/15 UCx:    Thank you for allowing pharmacy to be a part of this patient's care.  4/15, PharmD, BCPS, BCIDP Infectious Diseases Clinical Pharmacist Phone: [phone] 10/31/2020 9:23 AM

## 2020-11-01 DIAGNOSIS — L03116 Cellulitis of left lower limb: Secondary | ICD-10-CM | POA: Diagnosis not present

## 2020-11-01 MED ORDER — ORITAVANCIN DIPHOSPHATE 400 MG IV SOLR
1200.0000 mg | Freq: Once | INTRAVENOUS | Status: AC
  Administered 2020-11-01: 1200 mg via INTRAVENOUS
  Filled 2020-11-01: qty 120

## 2020-11-01 MED ORDER — METHOCARBAMOL 500 MG PO TABS
500.0000 mg | ORAL_TABLET | Freq: Three times a day (TID) | ORAL | Status: DC
  Administered 2020-11-01 – 2020-11-02 (×3): 500 mg via ORAL
  Filled 2020-11-01 (×3): qty 1

## 2020-11-01 MED ORDER — ENOXAPARIN SODIUM 40 MG/0.4ML ~~LOC~~ SOLN
40.0000 mg | Freq: Every day | SUBCUTANEOUS | Status: DC
  Administered 2020-11-01: 40 mg via SUBCUTANEOUS
  Filled 2020-11-01 (×2): qty 0.4

## 2020-11-01 MED ORDER — NICOTINE 21 MG/24HR TD PT24
21.0000 mg | MEDICATED_PATCH | Freq: Every day | TRANSDERMAL | Status: DC
  Administered 2020-11-01 – 2020-11-02 (×2): 21 mg via TRANSDERMAL
  Filled 2020-11-01 (×2): qty 1

## 2020-11-01 NOTE — Progress Notes (Addendum)
Offered patient Toradol to help with inflammation pain and patient refused.

## 2020-11-01 NOTE — Progress Notes (Signed)
    [facility]    Date of Admission:  10/30/2020   Total days of antibiotics 3                   ID: Joel Newton is a 30 y.o. male with   Principal Problem:   Cellulitis of left ankle Active Problems:   Obesity (BMI 30.0-34.9)    Subjective: Remains afebrile, upset about work issues. He is noticing slightly less pain and swelling to left ankle.  Medications:  . enoxaparin (LOVENOX) injection  40 mg Subcutaneous Daily  . methocarbamol  500 mg Oral TID    Objective: Vital signs in last 24 hours: Temp:  [97.9 F (36.6 C)-98.3 F (36.8 C)] 98 F (36.7 C) (03/17 1000) Pulse Rate:  [68-98] 98 (03/17 1000) Resp:  [14-18] 14 (03/17 1000) BP: (120-145)/(75-93) 120/75 (03/17 1000) SpO2:  [96 %-100 %] 100 % (03/17 1000) Physical Exam  Constitutional: He is oriented to person, place, and time. He appears well-developed and well-nourished. No distress.  HENT:  Mouth/Throat: Oropharynx is clear and moist. No oropharyngeal exudate.  Ext: still limited range of motion, less swelling, erythema improved Neurological: He is alert and oriented to person, place, and time.  Skin: Skin is warm and dry. No rash noted. No erythema.  Psychiatric: He has a normal mood and affect. His behavior is normal.     Lab Results Recent Labs    10/30/20 0158  WBC 9.9  HGB 13.4  HCT 37.7*  NA 135  K 3.5  CL 98  CO2 24  BUN 20  CREATININE 1.08   Liver Panel Recent Labs    10/30/20 0158  PROT 8.0  ALBUMIN 4.5  AST 23  ALT 25  ALKPHOS 89  BILITOT 1.1   Sedimentation Rate Recent Labs    10/30/20 0210  ESRSEDRATE 20*   C-Reactive Protein Recent Labs    10/30/20 0210  CRP 2.6*    Microbiology: reviewed Studies/Results: No results found.   Assessment/Plan: Left ankle cellulitis= will give one dose of oritavancin 1200mg  IV over 3 hrs starting at 5pm. Then he will not need any further abtx  Left ankle pain = defer to primary team for  management  Will sign off.  [facility] Cell: [phone] Pager: [phone]  11/01/2020, 2:55 PM

## 2020-11-01 NOTE — Progress Notes (Addendum)
PROGRESS NOTE    Joel Newton  JFH:[id number] DOB: Oct 31, 1990 DOA: 10/30/2020 PCP: Patient, No Pcp Per   Chief Complaint  Patient presents with  . Foot Pain  Brief Narrative: 30 year old male history history of IVDA admitted with left ankle pain swelling redness since 3/14 evening when he attempted to put weight on the left foot, had chills 1 to 2 days, and had last IV drug use a month ago. In the ED vitals with slight tachycardia x-ray left ankle soft tissue edema chest x-ray increased interstitial marking, labs showed lactic acidosis CRP 2.6 arthrocentesis was attempted but no fluid was obtained blood culture sent placed on empiric antibiotics MRI was ordered and patient admitted for possible left septic arthritis. Patient was admitted on IV antibiotics.  MRI showed cellulitis above the ankle no osteomyelitis septic joint or myositis.    Subjective: Seen and examined this morning.   He has been afebrile, difficulty with ambulation due to pain.  Refusing lab and also difficulty sticking Patient reports he was dreaming this morning when he woke up he did not realize and moved his eft lleg and it was hurting a lot. Reports Toradol is helping. Overall pain is about the same  Assessment & Plan: Left ankle swelling and pain probable cellulitis:MRI negative for osteomyelitis septic joint or myositis-and findings are compatible with cellulitis about the ankle.  Continue on vancomycin and cefazolin as per infectious disease.  Appreciate input.  Has a significant pain improving on Toradol also has oral available if needed.  Added muscle relaxant too.mobilize.  Follow-up blood culture which is negative so far.   History of remote IV drug abuse-he reports he has not used this since over a month or so - last admission for IVDA withdrawal was May 2021.  UDS positive for amphetamine and THC.  Lactic acidosis likely in the setting of cellulitis dehydration patient has not had any fever  leukocytosis.  Is resolved with fluids.  Prediabetes w/ hba1c 6.1-dietary education..  Morbid obesity BMI 30.9 will benefit with weight loss and PCP follow-up. Discussed about staying in the hospital to complete the treatment and not to leave AGAINST MEDICAL ADVICE.    Diet Order            Diet regular Room service appropriate? Yes; Fluid consistency: Thin  Diet effective now                 Patient's Body mass index is 30.91 kg/m.  DVT prophylaxis: enoxaparin (LOVENOX) injection 40 mg Start: 11/01/20 1000 SCDs Start: 10/30/20 9373 Code Status:   Code Status: Full Code  Family Communication: plan of care discussed with patient at bedside.  Status is: Inpatient Remains inpatient appropriate because:IV treatments appropriate due to intensity of illness or inability to take PO and Inpatient level of care appropriate due to severity of illness  Dispo: The patient is from: Home              Anticipated d/c is to: Home  2-3 days once pain is stable and he is more ambulatory.PT eval.             Patient currently is not medically stable to d/c.   Difficult to place patient No. Unresulted Labs (From admission, onward)          Start     Ordered   11/01/20 0500  Basic metabolic panel  Daily,   R     Question:  Specimen collection method  Answer:  Lab=Lab collect  10/31/20 1059   11/01/20 0500  CBC  Daily,   R     Question:  Specimen collection method  Answer:  Lab=Lab collect   10/31/20 1059   10/31/20 0500  C-reactive protein  Daily,   R      10/30/20 0624   10/31/20 0500  Sedimentation rate  Daily,   R      10/30/20 0624        Medications reviewed:  Scheduled Meds: . enoxaparin (LOVENOX) injection  40 mg Subcutaneous Daily   Continuous Infusions: . sodium chloride 50 mL/hr at 10/31/20 1040  .  ceFAZolin (ANCEF) IV 2 g (11/01/20 6962)  . vancomycin 1,500 mg (11/01/20 0953)    Consultants:see note  Procedures:see note  Antimicrobials: Anti-infectives (From  admission, onward)   Start     Dose/Rate Route Frequency Ordered Stop   10/31/20 1030  vancomycin (VANCOREADY) IVPB 1500 mg/300 mL        1,500 mg 150 mL/hr over 120 Minutes Intravenous Every 12 hours 10/31/20 0922     10/30/20 2000  ceFAZolin (ANCEF) IVPB 2g/100 mL premix        2 g 200 mL/hr over 30 Minutes Intravenous Every 8 hours 10/30/20 1500     10/30/20 1200  ceFEPIme (MAXIPIME) 2 g in sodium chloride 0.9 % 100 mL IVPB  Status:  Discontinued        2 g 200 mL/hr over 30 Minutes Intravenous Every 8 hours 10/30/20 0643 10/30/20 1500   10/30/20 1000  vancomycin (VANCOREADY) IVPB 1250 mg/250 mL  Status:  Discontinued        1,250 mg 166.7 mL/hr over 90 Minutes Intravenous Every 12 hours 10/30/20 0643 10/30/20 1500   10/30/20 0315  ceFEPIme (MAXIPIME) 2 g in sodium chloride 0.9 % 100 mL IVPB        2 g 200 mL/hr over 30 Minutes Intravenous  Once 10/30/20 0305 10/30/20 0444   10/30/20 0315  metroNIDAZOLE (FLAGYL) IVPB 500 mg        500 mg 100 mL/hr over 60 Minutes Intravenous  Once 10/30/20 0305 10/30/20 0423   10/30/20 0315  vancomycin (VANCOCIN) IVPB 1000 mg/200 mL premix  Status:  Discontinued        1,000 mg 200 mL/hr over 60 Minutes Intravenous  Once 10/30/20 0305 10/30/20 0307   10/30/20 0315  vancomycin (VANCOREADY) IVPB 2000 mg/400 mL        2,000 mg 200 mL/hr over 120 Minutes Intravenous  Once 10/30/20 0307 10/30/20 0534     Culture/Microbiology    Component Value Date/Time   SDES  10/30/2020 0651    IN/OUT CATH URINE Performed at [facility], [address], [place], [place] [address]    SPECREQUEST  10/30/2020 1324    NONE Performed at [facility], [address] [place], [place] [address]    CULT  10/30/2020 [phone]    NO GROWTH Performed at [facility], [address] [place], [place] [address]    REPTSTATUS 10/31/2020 FINAL 10/30/2020 3474    Other culture-see note  Objective: Vitals: Today's Vitals    11/01/20 0504 11/01/20 0900 11/01/20 0950 11/01/20 1000  BP:  127/78  120/75  Pulse:  69  98  Resp:  18  14  Temp:  98.3 F (36.8 C)  98 F (36.7 C)  TempSrc:  Oral  Oral  SpO2:  98%  100%  Weight:      Height:  PainSc: 3  8  8       Intake/Output Summary (Last 24 hours) at 11/01/2020 1059 Last data filed at 10/31/2020 1931 Gross per 24 hour  Intake 420 ml  Output 800 ml  Net -380 ml   Filed Weights   10/30/20 0051 10/30/20 1550  Weight: 90.7 kg 97.7 kg   Weight change:   Intake/Output from previous day: 03/16 0701 - 03/17 0700 In: 420 [P.O.:420] Out: 800 [Urine:800] Intake/Output this shift: No intake/output data recorded. Filed Weights   10/30/20 0051 10/30/20 1550  Weight: 90.7 kg 97.7 kg    Examination: General exam: AAOx3,NAD, weak appearing. HEENT:Oral mucosa moist, Ear/Nose WNL grossly, dentition normal. Respiratory system: bilaterally diminishedd,no wheezing or crackles,no use of accessory muscle Cardiovascular system: S1 & S2 +, No JVD,. Gastrointestinal system: Abdomen soft, NT,ND, BS+ Nervous System:Alert, awake, moving extremities and grossly nonfocal Extremities: Left ankle edematous swollen mildly reddish and tender to touch, distal peripheral pulses palpable.  Skin: No rashes,no icterus. MSK: Normal muscle bulk,tone, power   Data Reviewed: I have personally reviewed following labs and imaging studies CBC: Recent Labs  Lab 10/30/20 0158  WBC 9.9  NEUTROABS 7.1  HGB 13.4  HCT 37.7*  MCV 90.4  PLT 302   Basic Metabolic Panel: Recent Labs  Lab 10/30/20 0158  NA 135  K 3.5  CL 98  CO2 24  GLUCOSE 119*  BUN 20  CREATININE 1.08  CALCIUM 9.8   GFR: Estimated Creatinine Clearance: 118.3 mL/min (by C-G formula based on SCr of 1.08 mg/dL). Liver Function Tests: Recent Labs  Lab 10/30/20 0158  AST 23  ALT 25  ALKPHOS 89  BILITOT 1.1  PROT 8.0  ALBUMIN 4.5   No results for input(s): LIPASE, AMYLASE in the last 168 hours. No  results for input(s): AMMONIA in the last 168 hours. Coagulation Profile: Recent Labs  Lab 10/30/20 0158  INR 1.1   Cardiac Enzymes: No results for input(s): CKTOTAL, CKMB, CKMBINDEX, TROPONINI in the last 168 hours. BNP (last 3 results) No results for input(s): PROBNP in the last 8760 hours. HbA1C: Recent Labs    10/30/20 0210  HGBA1C 6.1*   CBG: No results for input(s): GLUCAP in the last 168 hours. Lipid Profile: No results for input(s): CHOL, HDL, LDLCALC, TRIG, CHOLHDL, LDLDIRECT in the last 72 hours. Thyroid Function Tests: No results for input(s): TSH, T4TOTAL, FREET4, T3FREE, THYROIDAB in the last 72 hours. Anemia Panel: No results for input(s): VITAMINB12, FOLATE, FERRITIN, TIBC, IRON, RETICCTPCT in the last 72 hours. Sepsis Labs: Recent Labs  Lab 10/30/20 0158 10/30/20 0531  LATICACIDVEN 2.2* 1.2    Recent Results (from the past 240 hour(s))  Blood culture (routine single)     Status: None (Preliminary result)   Collection Time: 10/30/20  1:58 AM   Specimen: BLOOD  Result Value Ref Range Status   Specimen Description   Final    BLOOD SITE NOT SPECIFIED Performed at [facility], [address] [place], [place] [address]    Special Requests   Final    BOTTLES DRAWN AEROBIC AND ANAEROBIC Blood Culture adequate volume Performed at [facility], [address] [place], [place] [address]    Culture   Final    NO GROWTH 2 DAYS Performed at [facility], [address] [place], [place] [address]    Report Status PENDING  Incomplete  SARS CORONAVIRUS 2 (TAT 6-24 HRS) Nasopharyngeal Nasopharyngeal Swab     Status: None  Collection Time: 10/30/20  2:10 AM   Specimen: Nasopharyngeal Swab  Result Value Ref Range Status   SARS Coronavirus 2 NEGATIVE NEGATIVE Final    Comment: (NOTE) SARS-CoV-2 target nucleic acids are NOT DETECTED.  The SARS-CoV-2 RNA is generally detectable in upper and lower respiratory specimens during the  acute phase of infection. Negative results do not preclude SARS-CoV-2 infection, do not rule out co-infections with other pathogens, and should not be used as the sole basis for treatment or other patient management decisions. Negative results must be combined with clinical observations, patient history, and epidemiological information. The expected result is Negative.  Fact Sheet for Patients: HairSlick.no  Fact Sheet for Healthcare Providers: quierodirigir.com  This test is not yet approved or cleared by the [place] FDA and  has been authorized for detection and/or diagnosis of SARS-CoV-2 by FDA under an Emergency Use Authorization (EUA). This EUA will remain  in effect (meaning this test can be used) for the duration of the COVID-19 declaration under Se ction 564(b)(1) of the Act, 21 U.S.C. section 360bbb-3(b)(1), unless the authorization is terminated or revoked sooner.  Performed at [facility], [address], [place], [place] [address]   Culture, blood (single)     Status: None (Preliminary result)   Collection Time: 10/30/20  3:25 AM   Specimen: BLOOD  Result Value Ref Range Status   Specimen Description   Final    BLOOD LEFT ANTECUBITAL Performed at [facility], [address], [place], [place] [address]    Special Requests   Final    BOTTLES DRAWN AEROBIC AND ANAEROBIC Blood Culture adequate volume Performed at [facility], [address] [place], [place] [address]    Culture   Final    NO GROWTH 2 DAYS Performed at [facility], [address] [place], [place] [address]    Report Status PENDING  Incomplete  Urine culture     Status: None   Collection Time: 10/30/20  6:51 AM   Specimen: In/Out Cath Urine  Result Value Ref Range Status   Specimen Description   Final    IN/OUT CATH URINE Performed at [facility], [address], [place], [place] [address]    Special Requests   Final    NONE Performed at [facility], [address] [place], [place] [address]    Culture   Final    NO GROWTH Performed at [facility], [address], [place], [place] [address]    Report Status 10/31/2020 FINAL  Final     Radiology Studies: MR ANKLE LEFT W WO CONTRAST  Result Date: 10/30/2020 CLINICAL DATA:  Onset left ankle pain, redness and limited range of motion yesterday. History of IV drug abuse. EXAM: MRI OF THE LEFT ANKLE WITHOUT AND WITH CONTRAST TECHNIQUE: Multiplanar, multisequence MR imaging of the left ankle was performed both before and after administration of intravenous contrast. CONTRAST:  9 mL GADAVIST IV SOLN COMPARISON:  Plain films left foot today. FINDINGS: Bones/Joint/Cartilage There is no marrow edema or enhancement to suggest osteomyelitis. No fracture, stress change or worrisome lesion is identified. No joint effusion. Ligaments Intact. Muscles and Tendons Intact and normal in appearance.  No tear evidence tenosynovitis. Soft tissues There is subcutaneous edema and enhancement about the ankle consistent with cellulitis. Negative for abscess. IMPRESSION: Findings compatible with cellulitis about the ankle. Negative for osteomyelitis, septic joint or myositis. Electronically Signed   By: Maisie Fus  Dalessio M.D.   On: 10/30/2020 11:42     LOS: 2 days   Lanae Boast, MD [facility]  11/01/2020, 10:59 AM

## 2020-11-01 NOTE — Progress Notes (Signed)
PT Cancellation Note  Patient Details Name: ZENITH LAMPHIER MRN: [id number] DOB: 22-May-1991   Cancelled Treatment:    Reason Eval/Treat Not Completed: Other (comment)Patient eating, just ambulated with RW. Will check back.   Rada Hay 11/01/2020, 10:17 AM  Blanchard Kelch PT Acute Rehabilitation Services Pager [phone] Office [phone]

## 2020-11-01 NOTE — Progress Notes (Signed)
OT Cancellation Note  Patient Details Name: Joel Newton MRN: [id number] DOB: 05-08-1991   Cancelled Treatment:    Reason Eval/Treat Not Completed: OT screened, no needs identified, will sign off. Per nursing patient ambulating with nursing staff and able to perform all ADLs. Only difficulty is pain in ankle and not wanting to weight bear. Defer to PT for ambulation deficits. No OT needs at this time.  Abiel Antrim L Quentin Shorey 11/01/2020, 11:30 AM

## 2020-11-01 NOTE — Progress Notes (Signed)
PT Cancellation Note  Patient Details Name: Joel Newton MRN: [id number] DOB: 1991-06-29   Cancelled Treatment:    Reason Eval/Treat Not Completed: Patient declined, no reason specified, patient declined x 2 today. Will follow.    Rada Hay 11/01/2020, 1:51 PM Blanchard Kelch PT Acute Rehabilitation Services Pager [phone] Office [phone]

## 2020-11-02 MED ORDER — METHOCARBAMOL 500 MG PO TABS: 500.0000 mg | ORAL_TABLET | Freq: Three times a day (TID) | ORAL | 0 refills | Status: DC | PRN

## 2020-11-02 MED ORDER — KETOROLAC TROMETHAMINE 30 MG/ML IJ SOLN
30.0000 mg | Freq: Once | INTRAMUSCULAR | Status: AC
  Administered 2020-11-02: 30 mg via INTRAVENOUS
  Filled 2020-11-02: qty 1

## 2020-11-02 MED ORDER — IBUPROFEN 600 MG PO TABS: 600.0000 mg | ORAL_TABLET | Freq: Three times a day (TID) | ORAL | 0 refills | Status: DC | PRN

## 2020-11-02 NOTE — Discharge Summary (Signed)
Physician Discharge Summary  Joel Newton HDQ:[id number] DOB: [birth year] DOA: 10/30/2020  PCP: Patient, No Pcp Per  Admit date: 10/30/2020 Discharge date: 11/02/2020  Admitted From: home Disposition:  home  Recommendations for Outpatient Follow-up:  1. Follow up with PCP , ID in 1 week or sooner if pain worse  Home Health:no  Equipment/Devices: CRUTCHES  Discharge Condition: Stable Code Status:   Code Status: Full Code Diet recommendation:  Diet Order            Diet Carb Modified           Diet regular Room service appropriate? Yes; Fluid consistency: Thin  Diet effective now                 Brief/Interim Summary: when he attempted to put weight on the left foot,had chills 1 to 2 days,and had last IV drug use a month ago. In the ED vitals with slight tachycardia x-ray left ankle soft tissue edema chest x-ray increased interstitial marking, labs showed lactic acidosis CRP 2.6 arthrocentesis was attempted but no fluid was obtained blood culture sent placed on empiric antibiotics MRI was ordered and patient admitted for possible left septic arthritis. Patient was admitted on IV antibiotics.  MRI showed cellulitis above the ankle no osteomyelitis septic joint or myositis.  He was managed with IV antibiotics.  Seen by infectious disease.  Placed on Toradol and pain control regimen.  Unable to get further labs as patient refused as he was difficult to stick. He worked with physical therapy today and okay to discharge on crutches. Patient requesting to be discharged to his home this am and reports his wife will come to pick him up.  Discharge Diagnoses:  Left ankle swelling and pain probable cellulitis:MRI negative for osteomyelitis/septic joint/myositis-and findings are compatible with cellulitis about the ankle.  Treated with vancomycin and cefazolin as per infectious disease.  ID switched to oritavancin x1 dose-at this time no need for further antibiotics and okay to  discharge.  Blood culture has remained negative.  Pain control with muscle rexalnt/ibuprofen at home.  If he has worsening pain fever chills redness he needs to be seen in the ED or by orthopedic or ID and I have provided numbers for that. His uric acid is low normal sed rate borderline up at 20 , os is CRP at 2.6.  Hepatitis panel negative  History of remote IV drug abuse-he reports he has not used this since over a month or so - last admission for IVDA withdrawal was May 2021.  UDS positive for amphetamine and THC.  Culture negative here.  Lactic acidosis likely in the setting of cellulitis dehydration patient has not had any fever leukocytosis.  Is resolved with fluids.  Prediabetes w/ hba1c 6.1-dietary education.  Carb controlled diet at home.  Morbid obesity BMI 30.9 will benefit with weight loss and PCP follow-u  Consults:  ID  Subjective: Alert awake oriented.  Worked with PT eating crutches. Requesting for discharge home today after he walked with PT " I am going home" RN reports patient saying he may leave AMA and asking for discharge.  Discharge Exam: Vitals:   11/02/20 0555 11/02/20 0812  BP: 117/82   Pulse: (!) 56   Resp: 18   Temp:  97.8 F (36.6 C)  SpO2: 95%    General: Pt is alert, awake, not in acute distress Cardiovascular: RRR, S1/S2 +, no rubs, no gallops Respiratory: CTA bilaterally, no wheezing, no rhonchi Abdominal: Soft, NT, ND, bowel  sounds + Extremities: no edema, no cyanosis.  Left foot swollen left ankle with some tenderness, redness improved.  Discharge Instructions  Discharge Instructions    Diet Carb Modified   Complete by: As directed    Discharge instructions   Complete by: As directed    Please follow-up with the infectious disease doctor if you have ongoing or worsening pain in the ankle.  You can also call orthopedic doctor or return to the ED  Please call call MD or return to ER for similar or worsening recurring problem that  brought you to hospital or if any fever,nausea/vomiting,abdominal pain, uncontrolled pain, chest pain,  shortness of breath or any other alarming symptoms.  Please follow-up your doctor as instructed in a week time and call the office for appointment.  Please avoid alcohol, smoking, or any other illicit substance and maintain healthy habits including taking your regular medications as prescribed.  You were cared for by a hospitalist during your hospital stay. If you have any questions about your discharge medications or the care you received while you were in the hospital after you are discharged, you can call the unit and ask to speak with the hospitalist on call if the hospitalist that took care of you is not available.  Once you are discharged, your primary care physician will handle any further medical issues. Please note that NO REFILLS for any discharge medications will be authorized once you are discharged, as it is imperative that you return to your primary care physician (or establish a relationship with a primary care physician if you do not have one) for your aftercare needs so that they can reassess your need for medications and monitor your lab values   Increase activity slowly   Complete by: As directed      Allergies as of 11/02/2020   No Known Allergies     Medication List    STOP taking these medications   naproxen 500 MG tablet Commonly known as: NAPROSYN   oxyCODONE-acetaminophen 5-325 MG tablet Commonly known as: PERCOCET/ROXICET     TAKE these medications   cloNIDine 0.1 MG tablet Commonly known as: CATAPRES Take 1 tablet (0.1 mg total) by mouth 3 (three) times daily for 4 days.   ibuprofen 600 MG tablet Commonly known as: ADVIL Take 1 tablet (600 mg total) by mouth every 8 (eight) hours as needed for up to 20 doses for moderate pain.   methocarbamol 500 MG tablet Commonly known as: ROBAXIN Take 1 tablet (500 mg total) by mouth every 8 (eight) hours as needed  for up to 15 doses for muscle spasms.       Follow-up Information    Judyann Munson, MD Follow up in 1 week(s).   Specialty: Infectious Diseases Contact information: [address] [place] [address] [phone]        Orth[facility], [facility]. Call in 3 day(s).   Specialty: Specialist Contact information: [address] [place] [address] [phone]              No Known Allergies  The results of significant diagnostics from this hospitalization (including imaging, microbiology, ancillary and laboratory) are listed below for reference.    Microbiology: Recent Results (from the past 240 hour(s))  Blood culture (routine single)     Status: None (Preliminary result)   Collection Time: 10/30/20  1:58 AM   Specimen: BLOOD  Result Value Ref Range Status   Specimen Description   Final  BLOOD SITE NOT SPECIFIED Performed at [facility], [address], [place], [place] [address]    Special Requests   Final    BOTTLES DRAWN AEROBIC AND ANAEROBIC Blood Culture adequate volume Performed at [facility], [address] [place], [place] [address]    Culture   Final    NO GROWTH 3 DAYS Performed at [facility], [address] [place], [place] [address]    Report Status PENDING  Incomplete  SARS CORONAVIRUS 2 (TAT 6-24 HRS) Nasopharyngeal Nasopharyngeal Swab     Status: None   Collection Time: 10/30/20  2:10 AM   Specimen: Nasopharyngeal Swab  Result Value Ref Range Status   SARS Coronavirus 2 NEGATIVE NEGATIVE Final    Comment: (NOTE) SARS-CoV-2 target nucleic acids are NOT DETECTED.  The SARS-CoV-2 RNA is generally detectable in upper and lower respiratory specimens during the acute phase of infection. Negative results do not preclude SARS-CoV-2 infection, do not rule out co-infections with other pathogens, and should not be used as the sole basis for treatment or other patient management  decisions. Negative results must be combined with clinical observations, patient history, and epidemiological information. The expected result is Negative.  Fact Sheet for Patients: HairSlick.no  Fact Sheet for Healthcare Providers: quierodirigir.com  This test is not yet approved or cleared by the [place] FDA and  has been authorized for detection and/or diagnosis of SARS-CoV-2 by FDA under an Emergency Use Authorization (EUA). This EUA will remain  in effect (meaning this test can be used) for the duration of the COVID-19 declaration under Se ction 564(b)(1) of the Act, 21 U.S.C. section 360bbb-3(b)(1), unless the authorization is terminated or revoked sooner.  Performed at [facility], [address], [place], [place] [address]   Culture, blood (single)     Status: None (Preliminary result)   Collection Time: 10/30/20  3:25 AM   Specimen: BLOOD  Result Value Ref Range Status   Specimen Description   Final    BLOOD LEFT ANTECUBITAL Performed at [facility], [address], [place], [place] [address]    Special Requests   Final    BOTTLES DRAWN AEROBIC AND ANAEROBIC Blood Culture adequate volume Performed at [facility], [address], [place], [place] [address]    Culture   Final    NO GROWTH 3 DAYS Performed at [facility], [address] [place], [place] [address]    Report Status PENDING  Incomplete  Urine culture     Status: None   Collection Time: 10/30/20  6:51 AM   Specimen: In/Out Cath Urine  Result Value Ref Range Status   Specimen Description   Final    IN/OUT CATH URINE Performed at [facility], [address] [place], [place] [address]    Special Requests   Final    NONE Performed at [facility], [address] [place], [place] [address]    Culture   Final    NO GROWTH Performed at [facility], [address], [place], [place] [address]    Report Status 10/31/2020 FINAL  Final    Procedures/Studies: MR ANKLE LEFT W WO CONTRAST  Result Date: 10/30/2020 CLINICAL DATA:  Onset left ankle pain, redness and limited range of motion yesterday. History of IV drug abuse. EXAM: MRI OF THE LEFT ANKLE WITHOUT AND WITH CONTRAST TECHNIQUE: Multiplanar, multisequence MR imaging of the left ankle  was performed both before and after administration of intravenous contrast. CONTRAST:  9 mL GADAVIST IV SOLN COMPARISON:  Plain films left foot today. FINDINGS: Bones/Joint/Cartilage There is no marrow edema or enhancement to suggest osteomyelitis. No fracture, stress change or worrisome lesion is identified. No joint effusion. Ligaments Intact. Muscles and Tendons Intact and normal in appearance.  No tear evidence tenosynovitis. Soft tissues There is subcutaneous edema and enhancement about the ankle consistent with cellulitis. Negative for abscess. IMPRESSION: Findings compatible with cellulitis about the ankle. Negative for osteomyelitis, septic joint or myositis. Electronically Signed   By: Drusilla Kannerhomas  Dalessio M.D.   On: 10/30/2020 11:42   DG Chest Port 1 View  Result Date: 10/30/2020 CLINICAL DATA:  Sepsis EXAM: PORTABLE CHEST 1 VIEW COMPARISON:  Chest x-ray 01/06/2016, CT chest 01/06/2016. FINDINGS: The heart size and mediastinal contours are within normal limits. Question right mid to lower lobe increased interstitial markings. No pleural effusion. No pneumothorax. No acute osseous abnormality. IMPRESSION: Question right mid to lower lobe increased interstitial markings. Electronically Signed   By: Tish FredericksonMorgane  Naveau M.D.   On: 10/30/2020 02:25   DG Foot 2 Views Left  Result Date: 10/30/2020 CLINICAL DATA:  Cellulitis EXAM: LEFT FOOT - 2 VIEW COMPARISON:  None. FINDINGS: Limited by positioning, technologist unable to acquire lateral view. The posterior foot is non included on the oblique views. No  subluxation. Edema in the soft tissues IMPRESSION: Limited study due to positioning. Edema in the soft tissues. No acute osseous abnormality. Electronically Signed   By: Jasmine PangKim  Fujinaga M.D.   On: 10/30/2020 01:25    Labs: BNP (last 3 results) No results for input(s): BNP in the last 8760 hours. Basic Metabolic Panel: Recent Labs  Lab 10/30/20 0158  NA 135  K 3.5  CL 98  CO2 24  GLUCOSE 119*  BUN 20  CREATININE 1.08  CALCIUM 9.8   Liver Function Tests: Recent Labs  Lab 10/30/20 0158  AST 23  ALT 25  ALKPHOS 89  BILITOT 1.1  PROT 8.0  ALBUMIN 4.5   No results for input(s): LIPASE, AMYLASE in the last 168 hours. No results for input(s): AMMONIA in the last 168 hours. CBC: Recent Labs  Lab 10/30/20 0158  WBC 9.9  NEUTROABS 7.1  HGB 13.4  HCT 37.7*  MCV 90.4  PLT 302   Cardiac Enzymes: No results for input(s): CKTOTAL, CKMB, CKMBINDEX, TROPONINI in the last 168 hours. BNP: Invalid input(s): POCBNP CBG: No results for input(s): GLUCAP in the last 168 hours. D-Dimer No results for input(s): DDIMER in the last 72 hours. Hgb A1c No results for input(s): HGBA1C in the last 72 hours. Lipid Profile No results for input(s): CHOL, HDL, LDLCALC, TRIG, CHOLHDL, LDLDIRECT in the last 72 hours. Thyroid function studies No results for input(s): TSH, T4TOTAL, T3FREE, THYROIDAB in the last 72 hours.  Invalid input(s): FREET3 Anemia work up No results for input(s): VITAMINB12, FOLATE, FERRITIN, TIBC, IRON, RETICCTPCT in the last 72 hours. Urinalysis    Component Value Date/Time   COLORURINE YELLOW 10/30/2020 0651   APPEARANCEUR CLEAR 10/30/2020 0651   LABSPEC 1.014 10/30/2020 0651   PHURINE 8.0 10/30/2020 0651   GLUCOSEU NEGATIVE 10/30/2020 0651   HGBUR NEGATIVE 10/30/2020 0651   BILIRUBINUR NEGATIVE 10/30/2020 0651   KETONESUR 20 (A) 10/30/2020 0651   PROTEINUR NEGATIVE 10/30/2020 0651   NITRITE NEGATIVE 10/30/2020 0651   LEUKOCYTESUR NEGATIVE 10/30/2020 0651    Sepsis Labs Invalid input(s): PROCALCITONIN,  WBC,  LACTICIDVEN Microbiology Recent Results (from the  past 240 hour(s))  Blood culture (routine single)     Status: None (Preliminary result)   Collection Time: 10/30/20  1:58 AM   Specimen: BLOOD  Result Value Ref Range Status   Specimen Description   Final    BLOOD SITE NOT SPECIFIED Performed at [facility], [address] [place], [place] [address]    Special Requests   Final    BOTTLES DRAWN AEROBIC AND ANAEROBIC Blood Culture adequate volume Performed at [facility], [address] [place], [place] [address]    Culture   Final    NO GROWTH 3 DAYS Performed at [facility], [address], [place], [place] [address]    Report Status PENDING  Incomplete  SARS CORONAVIRUS 2 (TAT 6-24 HRS) Nasopharyngeal Nasopharyngeal Swab     Status: None   Collection Time: 10/30/20  2:10 AM   Specimen: Nasopharyngeal Swab  Result Value Ref Range Status   SARS Coronavirus 2 NEGATIVE NEGATIVE Final    Comment: (NOTE) SARS-CoV-2 target nucleic acids are NOT DETECTED.  The SARS-CoV-2 RNA is generally detectable in upper and lower respiratory specimens during the acute phase of infection. Negative results do not preclude SARS-CoV-2 infection, do not rule out co-infections with other pathogens, and should not be used as the sole basis for treatment or other patient management decisions. Negative results must be combined with clinical observations, patient history, and epidemiological information. The expected result is Negative.  Fact Sheet for Patients: HairSlick.no  Fact Sheet for Healthcare Providers: quierodirigir.com  This test is not yet approved or cleared by the [place] FDA and  has been authorized for detection and/or diagnosis of SARS-CoV-2 by FDA under an Emergency Use Authorization (EUA). This EUA will remain  in effect (meaning  this test can be used) for the duration of the COVID-19 declaration under Se ction 564(b)(1) of the Act, 21 U.S.C. section 360bbb-3(b)(1), unless the authorization is terminated or revoked sooner.  Performed at [facility], [address] [place], [place] [address]   Culture, blood (single)     Status: None (Preliminary result)   Collection Time: 10/30/20  3:25 AM   Specimen: BLOOD  Result Value Ref Range Status   Specimen Description   Final    BLOOD LEFT ANTECUBITAL Performed at [facility], [address] [place], [place] [address]    Special Requests   Final    BOTTLES DRAWN AEROBIC AND ANAEROBIC Blood Culture adequate volume Performed at [facility], [address], [place], [place] [address]    Culture   Final    NO GROWTH 3 DAYS Performed at [facility], [address] [place], [place] [address]    Report Status PENDING  Incomplete  Urine culture     Status: None   Collection Time: 10/30/20  6:51 AM   Specimen: In/Out Cath Urine  Result Value Ref Range Status   Specimen Description   Final    IN/OUT CATH URINE Performed at [facility], [address], [place], [place] [address]    Special Requests   Final    NONE Performed at [facility], [address], [place], [place] [address]    Culture   Final    NO GROWTH Performed at [facility], [address] [place], [place] [address]    Report Status 10/31/2020 FINAL  Final     Time coordinating discharge: 25 minutes  SIGNED: Lanae Boast, MD  [facility] 11/02/2020, 2:14 PM  If 7PM-7AM, please contact night-coverage [email or web address]

## 2020-11-02 NOTE — Evaluation (Signed)
Physical Therapy Evaluation Only Patient Details Name: Joel Newton MRN: [id number] DOB: [birth year] Today's Date: 11/02/2020   History of Present Illness  Joel Newton is a 30 y.o. male with medical history significant for IV drug abuse, now presenting to the emergency department for evaluation of pain, swelling, and redness involving the left ankle. MRI negative, arthrocentesis was attempted but no fluid obtained.  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 Pt currently independent/modified independent with all mobility. Pt with stable gait using RW and LLE NWB so progressed to axillary crutches. Pt demonstrates good carryover with gait training using bil axillary crutches, educated on fit and safety with verbalized understanding. Pt slightly impulsive, but reports this is normal and he doesn't feel impulsive. Recommending bil axillary crutches due to high pain in L ankle and pt prefers LLE NWB. No PT f/u, all education complete, RN notified.     Follow Up Recommendations No PT follow up    Equipment Recommendations  Crutches    Recommendations for Other Services       Precautions / Restrictions Precautions Precautions: None Restrictions Weight Bearing Restrictions: No      Mobility  Bed Mobility Overal bed mobility: Independent   Transfers Overall transfer level: Modified independent Equipment used: Rolling walker (2 wheeled);Crutches   Ambulation/Gait Ambulation/Gait assistance: Modified independent (Device/Increase time) Gait Distance (Feet): 200 Feet Assistive device: Rolling walker (2 wheeled);Crutches    General Gait Details: LLE NWB held above floor, hop through pattern with RW successful, then performed with bil axillary crutches and successful, pt slightly impulsive but without LOB  Stairs            Wheelchair Mobility    Modified Rankin (Stroke Patients Only)        Balance Overall balance assessment: No apparent balance deficits (not formally assessed)        Pertinent Vitals/Pain Pain Assessment: 0-10 Pain Score: 8  Pain Location: L ankle Pain Intervention(s): Limited activity within patient's tolerance;Monitored during session;Premedicated before session;Repositioned    Home Living Family/patient expects to be discharged to:: Private residence Living Arrangements: Spouse/significant other Available Help at Discharge: Family;Available PRN/intermittently Type of Home: House Home Access: Ramped entrance     Home Layout: One level Home Equipment: None      Prior Function Level of Independence: Independent         Comments: Pt reports independent, drives, works as Conservator, museum/gallery full time lifting/positioning steal beams.     Hand Dominance   Dominant Hand: Right    Extremity/Trunk Assessment   Upper Extremity Assessment Upper Extremity Assessment: Overall WFL for tasks assessed    Lower Extremity Assessment Lower Extremity Assessment: RLE deficits/detail;LLE deficits/detail RLE Deficits / Details: AROM WNL, ankle and knee strength 5/5, hip strength 4/5, denies numbness/tingling RLE Sensation: WNL LLE Deficits / Details: hip AROM WNL and strength 4/5, knee AROM WNL and strength NT due to pain, ankle AROM WNL DF/PF LLE: Unable to fully assess due to pain LLE Sensation: WNL    Cervical / Trunk Assessment Cervical / Trunk Assessment: Normal  Communication   Communication: No difficulties  Cognition Arousal/Alertness: Awake/alert Behavior During Therapy: WFL for tasks assessed/performed Overall Cognitive Status: Within Functional Limits for tasks assessed     General Comments      Exercises     Assessment/Plan    PT Assessment Patent does not need any further PT services  PT Problem List  PT Treatment Interventions      PT Goals (Current goals can be found in the Care Plan section)  Acute Rehab PT  Goals Patient Stated Goal: go home today PT Goal Formulation: With patient Time For Goal Achievement: 11/02/20 Potential to Achieve Goals: Good    Frequency     Barriers to discharge        Co-evaluation               AM-PAC PT "6 Clicks" Mobility  Outcome Measure Help needed turning from your back to your side while in a flat bed without using bedrails?: None Help needed moving from lying on your back to sitting on the side of a flat bed without using bedrails?: None Help needed moving to and from a bed to a chair (including a wheelchair)?: None Help needed standing up from a chair using your arms (e.g., wheelchair or bedside chair)?: None Help needed to walk in hospital room?: None Help needed climbing 3-5 steps with a railing? : None 6 Click Score: 24    End of Session Equipment Utilized During Treatment: Gait belt Activity Tolerance: Patient tolerated treatment well Patient left: Other (comment);with call bell/phone within reach (in restroom) Nurse Communication: Mobility status;Other (comment) (pt in restroom) PT Visit Diagnosis: Pain Pain - Right/Left: Left Pain - part of body: Ankle and joints of foot    Time: 4917-9150 PT Time Calculation (min) (ACUTE ONLY): 23 min   Charges:   PT Evaluation $PT Eval Low Complexity: 1 Low PT Treatments $Gait Training: 8-22 mins         Tori Broussard PT, DPT 11/02/20, 10:43 AM

## 2020-11-02 NOTE — Progress Notes (Addendum)
Went in to review discharge with patient, he first said he didn't have a ride home, when advised transport would be set up he then said his wife says he cannot come back to their home, CCM notified, sister showed up to hospital, pt declined discharge paperwork.

## 2020-11-06 LAB — CULTURE, BLOOD (SINGLE)
Culture: NO GROWTH
Culture: NO GROWTH
Special Requests: ADEQUATE
Special Requests: ADEQUATE

## 2021-02-04 ENCOUNTER — Encounter (HOSPITAL_COMMUNITY): Payer: Self-pay | Admitting: Emergency Medicine

## 2021-02-04 ENCOUNTER — Ambulatory Visit (HOSPITAL_COMMUNITY)
Admission: EM | Admit: 2021-02-04 | Discharge: 2021-02-04 | Disposition: A | Discharge status: Home / Self Care | Payer: Medicaid Other

## 2021-02-04 ENCOUNTER — Other Ambulatory Visit: Payer: Self-pay

## 2021-02-04 DIAGNOSIS — M7989 Other specified soft tissue disorders: Secondary | ICD-10-CM | POA: Diagnosis not present

## 2021-02-04 NOTE — ED Provider Notes (Signed)
[facility]    CSN: [id number] Arrival date & time: 02/04/21  1357      History   Chief Complaint Chief Complaint  Patient presents with   Insect Bite   Recurrent Skin Infections    HPI Joel Newton is a 30 y.o. male.   Patient presents with left hand swelling, pain and decreased movement after alleged brown recluse spider bite 2 days ago. Spider bite is around base on thumb, was able to squeeze pus from bite mark.  Has three markings along left flank from spider as well. Attest to seeing and killing recluse spider. Unable to make fist. Tingling present in fingers. Warmth felt in hand. Denies fever and chills.   History reviewed. No pertinent past medical history.  Patient Active Problem List   Diagnosis Date Noted   Cellulitis of left ankle 10/31/2020   Obesity (BMI 30.0-34.9) 10/31/2020   IVDU (intravenous drug user)     Past Surgical History:  Procedure Laterality Date   arm surgery     LEG SURGERY         Home Medications    Prior to Admission medications   Medication Sig Start Date End Date Taking? Authorizing Provider  cloNIDine (CATAPRES) 0.1 MG tablet Take 1 tablet (0.1 mg total) by mouth 3 (three) times daily for 4 days. Patient not taking: Reported on 02/04/2021 01/06/20 01/10/20  Virgina Norfolk, DO  ibuprofen (ADVIL) 600 MG tablet Take 1 tablet (600 mg total) by mouth every 8 (eight) hours as needed for up to 20 doses for moderate pain. 11/02/20   Lanae Boast, MD  methocarbamol (ROBAXIN) 500 MG tablet Take 1 tablet (500 mg total) by mouth every 8 (eight) hours as needed for up to 15 doses for muscle spasms. Patient not taking: Reported on 02/04/2021 11/02/20   Lanae Boast, MD    Family History History reviewed. No pertinent family history.  Social History Social History   Tobacco Use   Smoking status: Every Day    Pack years: 0.00    Types: Cigarettes   Smokeless tobacco: Never  Vaping Use   Vaping Use: Never used  Substance Use  Topics   Alcohol use: No   Drug use: Yes    Types: Marijuana     Allergies   Patient has no known allergies.   Review of Systems Review of Systems   Physical Exam Triage Vital Signs ED Triage Vitals  Enc Vitals Group     BP 02/04/21 1616 138/74     Pulse Rate 02/04/21 1616 66     Resp 02/04/21 1616 18     Temp 02/04/21 1616 98.9 F (37.2 C)     Temp Source 02/04/21 1616 Oral     SpO2 02/04/21 1616 98 %     Weight --      Height --      Head Circumference --      Peak Flow --      Pain Score 02/04/21 1613 8     Pain Loc --      Pain Edu? --      Excl. in GC? --    No data found.  Updated Vital Signs BP 138/74 (BP Location: Right Arm)   Pulse 66   Temp 98.9 F (37.2 C) (Oral)   Resp 18   SpO2 98%   Visual Acuity Right Eye Distance:   Left Eye Distance:   Bilateral Distance:    Right Eye Near:   Left Eye  Near:    Bilateral Near:     Physical Exam   UC Treatments / Results  Labs (all labs ordered are listed, but only abnormal results are displayed) Labs Reviewed - No data to display  EKG   Radiology No results found.  Procedures Procedures (including critical care time)  Medications Ordered in UC Medications - No data to display  Initial Impression / Assessment and Plan / UC Course  I have reviewed the triage vital signs and the nursing notes.  Pertinent labs & imaging results that were available during my care of the patient were reviewed by me and considered in my medical decision making (see chart for details).  Due to placement of initial injury over the joint of thumb, patient sent to emergency department for evaluation of depth of infection and need for intravenous antibiotics.  Final Clinical Impressions(s) / UC Diagnoses   Final diagnoses:  Swelling of left hand     Discharge Instructions      Go to nearest emergency department for evaluation    ED Prescriptions   None    PDMP not reviewed this encounter.   Valinda Hoar, NP 02/04/21 1710

## 2021-02-04 NOTE — ED Triage Notes (Signed)
Patient has a swollen left hand, redness and pain.  Patient reports there was a pimple at base of thumb that he squeezed and pus came out.  This was 2 days ago.  Patient has 3 bumps on left side of torso, white top to bumps

## 2021-02-04 NOTE — Discharge Instructions (Addendum)
Go to nearest emergency department for evaluation

## 2021-04-14 ENCOUNTER — Other Ambulatory Visit: Payer: Self-pay

## 2021-04-14 ENCOUNTER — Other Ambulatory Visit (HOSPITAL_COMMUNITY)
Admission: EM | Admit: 2021-04-14 | Discharge: 2021-04-15 | Disposition: A | Discharge status: Home / Self Care | Payer: Medicaid Other | Attending: Psychiatry | Admitting: Psychiatry

## 2021-04-14 DIAGNOSIS — Z20822 Contact with and (suspected) exposure to covid-19: Secondary | ICD-10-CM | POA: Diagnosis not present

## 2021-04-14 DIAGNOSIS — F1994 Other psychoactive substance use, unspecified with psychoactive substance-induced mood disorder: Secondary | ICD-10-CM | POA: Diagnosis present

## 2021-04-14 DIAGNOSIS — F1124 Opioid dependence with opioid-induced mood disorder: Secondary | ICD-10-CM | POA: Insufficient documentation

## 2021-04-14 LAB — COMPREHENSIVE METABOLIC PANEL
ALT: 22 U/L (ref 0–44)
AST: 18 U/L (ref 15–41)
Albumin: 4.4 g/dL (ref 3.5–5.0)
Alkaline Phosphatase: 74 U/L (ref 38–126)
Anion gap: 11 (ref 5–15)
BUN: 14 mg/dL (ref 6–20)
CO2: 27 mmol/L (ref 22–32)
Calcium: 9.9 mg/dL (ref 8.9–10.3)
Chloride: 99 mmol/L (ref 98–111)
Creatinine, Ser: 0.99 mg/dL (ref 0.61–1.24)
GFR, Estimated: >60 mL/min (ref >60)
Glucose, Bld: 100 mg/dL — ABNORMAL HIGH (ref 70–99)
Potassium: 4.1 mmol/L (ref 3.5–5.1)
Sodium: 137 mmol/L (ref 135–145)
Total Bilirubin: 0.9 mg/dL (ref 0.3–1.2)
Total Protein: 7.5 g/dL (ref 6.5–8.1)

## 2021-04-14 LAB — CBC WITH DIFFERENTIAL/PLATELET
Abs Immature Granulocytes: 0.02 10*3/uL (ref 0.00–0.07)
Basophils Absolute: 0 10*3/uL (ref 0.0–0.1)
Basophils Relative: 0 %
Eosinophils Absolute: 0 10*3/uL (ref 0.0–0.5)
Eosinophils Relative: 0 %
HCT: 41.8 % (ref 39.0–52.0)
Hemoglobin: 14.4 g/dL (ref 13.0–17.0)
Immature Granulocytes: 0 %
Lymphocytes Relative: 18 %
Lymphs Abs: 1.3 10*3/uL (ref 0.7–4.0)
MCH: 31.8 pg (ref 26.0–34.0)
MCHC: 34.4 g/dL (ref 30.0–36.0)
MCV: 92.3 fL (ref 80.0–100.0)
Monocytes Absolute: 0.8 10*3/uL (ref 0.1–1.0)
Monocytes Relative: 11 %
Neutro Abs: 5 10*3/uL (ref 1.7–7.7)
Neutrophils Relative %: 71 %
Platelets: 311 10*3/uL (ref 150–400)
RBC: 4.53 MIL/uL (ref 4.22–5.81)
RDW: 11.9 % (ref 11.5–15.5)
WBC: 7.1 10*3/uL (ref 4.0–10.5)
nRBC: 0 % (ref 0.0–0.2)

## 2021-04-14 LAB — LIPID PANEL
Cholesterol: 218 mg/dL — ABNORMAL HIGH (ref 0–200)
HDL: 38 mg/dL — ABNORMAL LOW (ref >40)
LDL Cholesterol: 162 mg/dL — ABNORMAL HIGH (ref 0–99)
Total CHOL/HDL Ratio: 5.7 RATIO
Triglycerides: 91 mg/dL (ref <150)
VLDL: 18 mg/dL (ref 0–40)

## 2021-04-14 LAB — TSH: TSH: 0.92 u[IU]/mL (ref 0.350–4.500)

## 2021-04-14 LAB — URINALYSIS, COMPLETE (UACMP) WITH MICROSCOPIC
Bacteria, UA: NONE SEEN
Bilirubin Urine: NEGATIVE
Glucose, UA: NEGATIVE mg/dL
Hgb urine dipstick: NEGATIVE
Ketones, ur: NEGATIVE mg/dL
Leukocytes,Ua: NEGATIVE
Nitrite: NEGATIVE
Protein, ur: NEGATIVE mg/dL
Specific Gravity, Urine: 1.027 (ref 1.005–1.030)
pH: 5 (ref 5.0–8.0)

## 2021-04-14 LAB — POCT URINE DRUG SCREEN - MANUAL ENTRY (I-SCREEN)
POC Amphetamine UR: NOT DETECTED
POC Buprenorphine (BUP): NOT DETECTED
POC Cocaine UR: POSITIVE — AB
POC Marijuana UR: POSITIVE — AB
POC Methadone UR: NOT DETECTED
POC Methamphetamine UR: NOT DETECTED
POC Morphine: NOT DETECTED
POC Oxazepam (BZO): NOT DETECTED
POC Oxycodone UR: NOT DETECTED
POC Secobarbital (BAR): NOT DETECTED

## 2021-04-14 LAB — HEMOGLOBIN A1C
Hgb A1c MFr Bld: 6 % — ABNORMAL HIGH (ref 4.8–5.6)
Mean Plasma Glucose: 125.5 mg/dL

## 2021-04-14 LAB — MAGNESIUM: Magnesium: 2.1 mg/dL (ref 1.7–2.4)

## 2021-04-14 LAB — RESP PANEL BY RT-PCR (FLU A&B, COVID) ARPGX2
Influenza A by PCR: NEGATIVE
Influenza B by PCR: NEGATIVE
SARS Coronavirus 2 by RT PCR: NEGATIVE

## 2021-04-14 LAB — POC SARS CORONAVIRUS 2 AG -  ED: SARS Coronavirus 2 Ag: NEGATIVE

## 2021-04-14 LAB — POC SARS CORONAVIRUS 2 AG: SARSCOV2ONAVIRUS 2 AG: NEGATIVE

## 2021-04-14 LAB — ETHANOL: Alcohol, Ethyl (B): <10 mg/dL (ref <10)

## 2021-04-14 MED ORDER — CLONIDINE HCL 0.1 MG PO TABS
0.1000 mg | ORAL_TABLET | ORAL | Status: DC
Start: 2021-04-17 — End: 2021-04-15

## 2021-04-14 MED ORDER — ONDANSETRON 4 MG PO TBDP
4.0000 mg | ORAL_TABLET | Freq: Four times a day (QID) | ORAL | Status: DC | PRN
  Administered 2021-04-14: 4 mg via ORAL
  Filled 2021-04-14: qty 1

## 2021-04-14 MED ORDER — ACETAMINOPHEN 325 MG PO TABS
650.0000 mg | ORAL_TABLET | Freq: Four times a day (QID) | ORAL | Status: DC | PRN
  Administered 2021-04-15: 650 mg via ORAL
  Filled 2021-04-14: qty 2

## 2021-04-14 MED ORDER — MAGNESIUM HYDROXIDE 400 MG/5ML PO SUSP: 30.0000 mL | Freq: Every day | ORAL | Status: DC | PRN

## 2021-04-14 MED ORDER — DICYCLOMINE HCL 20 MG PO TABS: 20.0000 mg | ORAL_TABLET | Freq: Four times a day (QID) | ORAL | Status: DC | PRN

## 2021-04-14 MED ORDER — HYDROXYZINE HCL 25 MG PO TABS: 25.0000 mg | ORAL_TABLET | Freq: Three times a day (TID) | ORAL | Status: DC | PRN

## 2021-04-14 MED ORDER — HYDROXYZINE HCL 25 MG PO TABS: 25.0000 mg | ORAL_TABLET | Freq: Four times a day (QID) | ORAL | Status: DC | PRN

## 2021-04-14 MED ORDER — CLONIDINE HCL 0.1 MG PO TABS: 0.1000 mg | ORAL_TABLET | Freq: Every day | ORAL | Status: DC

## 2021-04-14 MED ORDER — ARIPIPRAZOLE 2 MG PO TABS
2.0000 mg | ORAL_TABLET | Freq: Every day | ORAL | Status: DC
  Administered 2021-04-14 – 2021-04-15 (×2): 2 mg via ORAL
  Filled 2021-04-14 (×2): qty 1

## 2021-04-14 MED ORDER — LOPERAMIDE HCL 2 MG PO CAPS: 2.0000 mg | ORAL_CAPSULE | ORAL | Status: DC | PRN

## 2021-04-14 MED ORDER — METHOCARBAMOL 500 MG PO TABS
500.0000 mg | ORAL_TABLET | Freq: Three times a day (TID) | ORAL | Status: DC | PRN
  Administered 2021-04-15: 500 mg via ORAL
  Filled 2021-04-14: qty 1

## 2021-04-14 MED ORDER — NAPROXEN 500 MG PO TABS
500.0000 mg | ORAL_TABLET | Freq: Two times a day (BID) | ORAL | Status: DC | PRN
  Administered 2021-04-15: 500 mg via ORAL
  Filled 2021-04-14: qty 1

## 2021-04-14 MED ORDER — CLONIDINE HCL 0.1 MG PO TABS
0.1000 mg | ORAL_TABLET | Freq: Four times a day (QID) | ORAL | Status: DC
  Administered 2021-04-14 – 2021-04-15 (×3): 0.1 mg via ORAL
  Filled 2021-04-14 (×3): qty 1

## 2021-04-14 MED ORDER — ALUM & MAG HYDROXIDE-SIMETH 200-200-20 MG/5ML PO SUSP: 30.0000 mL | ORAL | Status: DC | PRN

## 2021-04-14 MED ORDER — GABAPENTIN 100 MG PO CAPS
100.0000 mg | ORAL_CAPSULE | Freq: Three times a day (TID) | ORAL | Status: DC
  Administered 2021-04-14 – 2021-04-15 (×3): 100 mg via ORAL
  Filled 2021-04-14 (×3): qty 1

## 2021-04-14 NOTE — BH Assessment (Signed)
Joel Newton. Westerhoff, Urgent, MR #[id number]; 30 years old presents to [facility] alone.  Pt reports homicidal ideations, " I am going to hurt myself by overdosing and jumping from a bridge".  Pt reports that he is using heroin and fentanyl daily.  Pt denies HI and AVH.  Pt admits to piro MH diagnosis; also reports that he is not taking proscribed medication for symptom management.  MSE signed by patient.

## 2021-04-14 NOTE — Progress Notes (Signed)
The skin assessment was completed with Renae, LPN. He has multiple tattoo on his upper body and 1-2 lower leg. He has several  body piercing. Later he was transferred to the [facility], cooperative with the admission process, oriented to his new environment and received nourishments.

## 2021-04-14 NOTE — ED Notes (Signed)
Patient is in bed asleep, respirations are even and unlabored, environment check complete, will continue to monitor patient for safety.

## 2021-04-14 NOTE — ED Notes (Signed)
PT stated that he was nauseous, pt was given zofran

## 2021-04-14 NOTE — ED Notes (Signed)
Pt is in room sleeping, respirations are even/unlabored, environmental check complete/secure, will continue to monitor patient for safety

## 2021-04-14 NOTE — BH Assessment (Signed)
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, With moderate or severe use disorder  Flowsheet Row [facility] (Discharged) from 10[facility]/2020 [facility]  C-SSRS RISK CATEGORY High Risk No Risk No Risk      The patient demonstrates the following risk factors for suicide: Chronic risk factors for suicide include: psychiatric disorder of Opioid induced depressive disorder, previous self harm by cutting his forearms and substance use disorder. Acute risk factors for suicide include: unemployment, social withdrawal/isolation, and loss (financial, interpersonal, professional). Protective factors for this patient include: positive social support, positive therapeutic relationship, responsibility to others (children, family), coping skills, hope for the future, and life satisfaction. Considering these factors, the overall suicide risk at this point appears to be high. Patient is not appropriate for outpatient follow up.  High Risk = 1:1 sitter  Disposition Joel Gamblesarolyn Coleman NP, [facility].  Disposition discussed with Web designerBoston RN at [facility].  Loreta AveChristopher Newton is a 30 years old male who [facility] and unaccompanied.  Pt reports suicidal ideations "I am going to overdose or jump from a bridge".  Pt reports that he has been feeling depressed for several weeks.  Pt acknowledged the following feelings: isolation, irritable hopelessness, fatigue, worthlessness and feeling guilty.  Pt reports that he is not sleeping at night, due to homeless.  Pt reports that he is skipping meals daily.   Pt reports intentional self harm at age 89, "I tried to cut my  throat".  Pt denied HI and AVH. Pt denied paranoia.  Pt reports that he is using heroin and Fentanyl daily; also reports he has a $200.00 dollar day habit.  Pt reports that he smoke a pack a cigarettes daily.  Pt denied any other substance used.  Pt identify his primary stressor as being unemployed and homeless.  Pt reports no history of mental illness or substance used.  Pt reports that his mother died two years ago and he started using drugs. Pt denies any current legal problems.  Pt reports no weapons or guns are in his possessions.  Pt says he is currently not receiving weekly outpatient therapy; also is not receiving outpatient medication.  Pt reports that [facility], no longer participating in program.  Pt reports no previous inpatient psychiatric hospitalization.  Pt is dressed is casual, alert, oriented x 5 with soft speech and restless motor behavior.  Eye contact is normal, Pt is tearful.  Pt mood is depressed and affect is tearful.  Thought process is relevant.  Pt's insight is lacking and judgment is impaired.  There is no indication Pt is currently responding to internal stimuli or experiencing delusional thought content.  Pt was cooperative throughout assessment.   CCA Screening, Triage and Referral (STR)  Patient Reported Information How did you hear about us? Self  What Is the Reason for Your Visit/Call Today? Detox, Depression  How Long Has This Been Causing You Problems? 1 wk - 1 month  What Do You Feel Would Help You the Most Today? Alcohol or Drug Use Treatment; Treatment for Depression or other mood problem   Have You Recently Had Any Thoughts About  Hurting Yourself? Yes  Are You Planning to Commit Suicide/Harm Yourself At This time? Yes   Have you Recently Had Thoughts About Hurting Someone [name]? No  Are You Planning to Harm Someone at This Time? No  Explanation: No data recorded  Have You Used Any Alcohol  or Drugs in the Past 24 Hours? Yes  How Long Ago Did You Use Drugs or Alcohol? No data recorded What Did You Use and How Much? Heroin and Fentanyl   Do You Currently Have a Therapist/Psychiatrist? No data recorded Name of Therapist/Psychiatrist: No data recorded  Have You Been Recently Discharged From Any Office Practice or Programs? No data recorded Explanation of Discharge From Practice/Program: No data recorded    CCA Screening Triage Referral Assessment Type of Contact: No data recorded Telemedicine Service Delivery:   Is this Initial or Reassessment? No data recorded Date Telepsych consult ordered in [facility]:  No data recorded Time Telepsych consult ordered in [facility]:  No data recorded Location of Assessment: No data recorded Provider Location: No data recorded  Collateral Involvement: No data recorded  Does Patient Have a Court Appointed Legal Guardian? No data recorded Name and Contact of Legal Guardian: No data recorded If Minor and Not Living with Parent(s), Who has Custody? No data recorded Is CPS involved or ever been involved? No data recorded Is APS involved or ever been involved? No data recorded  Patient Determined To Be At Risk for Harm To Self or Others Based on Review of Patient Reported Information or Presenting Complaint? No data recorded Method: No data recorded Availability of Means: No data recorded Intent: No data recorded Notification Required: No data recorded Additional Information for Danger to Others Potential: No data recorded Additional Comments for Danger to Others Potential: No data recorded Are There Guns or Other Weapons in Your Home? No data recorded Types of Guns/Weapons: No data recorded Are These Weapons Safely Secured?                            No data recorded Who Could Verify You Are Able To Have These Secured: No data recorded Do You Have any Outstanding Charges, Pending Court Dates, Parole/Probation? No data recorded Contacted To Inform  of Risk of Harm To Self or Others: No data recorded   Does Patient Present under Involuntary Commitment? No data recorded IVC Papers Initial File Date: No data recorded  [place] of Residence: No data recorded  Patient Currently Receiving the Following Services: No data recorded  Determination of Need: Urgent (48 hours)   Options For Referral: [facility]     CCA Biopsychosocial Patient Reported Schizophrenia/Schizoaffective Diagnosis in Past: No   Strengths: Accepting responsibility   Mental Health Symptoms Depression:   Difficulty Concentrating; Hopelessness; Irritability; Worthlessness; Tearfulness   Duration of Depressive symptoms:    Mania:   None   Anxiety:    Restlessness; Worrying   Psychosis:   None   Duration of Psychotic symptoms:    Trauma:   Irritability/anger (Pt reports his mother died two years ago.)   Obsessions:   Disrupts routine/functioning   Compulsions:   "Driven" to perform behaviors/acts; Disrupts with routine/functioning   Inattention:   None   Hyperactivity/Impulsivity:   None   Oppositional/Defiant Behaviors:   None   Emotional Irregularity:   Recurrent suicidal behaviors/gestures/threats; Chronic feelings of emptiness; Frantic efforts to avoid abandonment   Other Mood/Personality Symptoms:   depressed/irritable    Mental Status Exam Appearance  and self-care  Stature:   Average   Weight:   Average weight   Clothing:   Disheveled   Grooming:   Normal   Cosmetic use:   Age appropriate   Posture/gait:   Normal   Motor activity:   Restless; Agitated   Sensorium  Attention:   Normal   Concentration:   Normal   Orientation:   X5   Recall/memory:   Normal   Affect and Mood  Affect:   Tearful; Depressed   Mood:   Depressed   Relating  Eye contact:   None   Facial expression:   Sad; Depressed   Attitude toward examiner:   Cooperative   Thought and Language  Speech flow:  Soft    Thought content:   Suspicious   Preoccupation:   Suicide   Hallucinations:   None   Organization:  No data recorded  Company secretary of Knowledge:   Average   Intelligence:   Average   Abstraction:   Functional   Judgement:   Impaired   Reality Testing:   Adequate   Insight:   Lacking   Decision Making:   Normal   Social Functioning  Social Maturity:   Isolates   Social Judgement:   Impropriety   Stress  Stressors:   Housing   Coping Ability:   Exhausted; Overwhelmed   Skill Deficits:   Self-care; Self-control; Decision making   Supports:   Support needed     Religion: Religion/Spirituality Are You A Religious Person?:  (UTA) How Might This Affect Treatment?: UTA  Leisure/Recreation: Leisure / Recreation Do You Have Hobbies?:  (UTA)  Exercise/Diet: Exercise/Diet Do You Exercise?: Yes What Type of Exercise Do You Do?: Run/Walk How Many Times a Week Do You Exercise?: 1-3 times a week Have You Gained or Lost A Significant Amount of Weight in the Past Six Months?: No Do You Follow a Special Diet?: No (Pt reports that he eats once a day.) Do You Have Any Trouble Sleeping?: Yes Explanation of Sleeping Difficulties: Pt reports two hours of sleep during the night, unable to sleep due to homelessness   CCA Employment/Education Employment/Work Situation: Employment / Work Situation Employment Situation: Unemployed Patient's Job has Been Impacted by Current Illness: No Has Patient ever Been in Equities trader?: No  Education: Education Is Patient Currently Attending School?: No Last Grade Completed: 11 Did You Product manager?: No Did You Have An Individualized Education Program (IIEP):  (UTA) Did You Have Any Difficulty At School?:  (UTA) Patient's Education Has Been Impacted by Current Illness:  (UTA)   CCA Family/Childhood History Family and Relationship History: Family history Marital status: Divorced Divorced, when?:  UTA What types of issues is patient dealing with in the relationship?: UTA Additional relationship information: UTA Does patient have children?: Yes How many children?: 3 How is patient's relationship with their children?: distance  Childhood History:  Childhood History By whom was/is the patient raised?: Mother Did patient suffer any verbal/emotional/physical/sexual abuse as a child?: No Did patient suffer from severe childhood neglect?: No Has patient ever been sexually abused/assaulted/raped as an adolescent or adult?: No Was the patient ever a victim of a crime or a disaster?: No Witnessed domestic violence?: No Has patient been affected by domestic violence as an adult?: No  Child/Adolescent Assessment:     CCA Substance Use Alcohol/Drug Use: Alcohol / Drug Use Pain Medications: See MRA Prescriptions: See MRA Over the Counter: See MRA History of alcohol / drug use?: Yes Longest  period of sobriety (when/how long): no Sobreity Negative Consequences of Use: Personal relationships, Financial Withdrawal Symptoms: Agitation, Weakness Substance #1 Name of Substance 1: Heroin and Fentayl 1 - Age of First Use: 28 1 - Amount (size/oz): $200.00 hundred dollar a day 1 - Frequency: daily 1 - Duration: ongoing 1 - Last Use / Amount: 04/13/21 1 - Method of Aquiring: UTA 1- Route of Use: UTA Substance #2 Name of Substance 2: Cigerrettes 2 - Age of First Use: UTA 2 - Amount (size/oz): 20 a day 2 - Frequency: daily 2 - Duration: ongoing 2 - Last Use / Amount: 04/14/21 2 - Method of Aquiring: purchase 2 - Route of Substance Use: UTA                     ASAM's:  Six Dimensions of Multidimensional Assessment  Dimension 1:  Acute Intoxication and/or Withdrawal Potential:   Dimension 1:  Description of individual's past and current experiences of substance use and withdrawal: Pt reports he used both fentanyl and heroin daily.  Dimension 2:  Biomedical Conditions and  Complications:   Dimension 2:  Description of patient's biomedical conditions and  complications: pt deindi biomedical conditons  Dimension 3:  Emotional, Behavioral, or Cognitive Conditions and Complications:  Dimension 3:  Description of emotional, behavioral, or cognitive conditions and complications: Bipolard, PTSD  Dimension 4:  Readiness to Change:  Dimension 4:  Description of Readiness to Change criteria: Pt reports that he wants to cut down and stop  Dimension 5:  Relapse, Continued use, or Continued Problem Potential:  Dimension 5:  Relapse, continued use, or continued problem potential critiera description: continued use  Dimension 6:  Recovery/Living Environment:  Dimension 6:  Recovery/Iiving environment criteria description: Pt reports that he is homeless  ASAM Severity Score: ASAM's Severity Rating Score: 12  ASAM Recommended Level of Treatment: ASAM Recommended Level of Treatment: Level II Intensive Outpatient Treatment   Substance use Disorder (SUD) Substance Use Disorder (SUD)  Checklist Symptoms of Substance Use: Continued use despite having a persistent/recurrent physical/psychological problem caused/exacerbated by use, Continued use despite persistent or recurrent social, interpersonal problems, caused or exacerbated by use, Evidence of tolerance, Evidence of withdrawal (Comment), Large amounts of time spent to obtain, use or recover from the substance(s), Presence of craving or strong urge to use, Persistent desire or unsuccessful efforts to cut down or control use, Repeated use in physically hazardous situations, Substance(s) often taken in larger amounts or over longer times than was intended  Recommendations for Services/Supports/Treatments: Recommendations for Services/Supports/Treatments Recommendations For Services/Supports/Treatments: Facility Based Crisis  Discharge Disposition:    DSM5 Diagnoses: Patient Active Problem List   Diagnosis Date Noted   Cellulitis of  left ankle 10/31/2020   Obesity (BMI 30.0-34.9) 10/31/2020   IVDU (intravenous drug user)      Referrals to Alternative Service(s): Referred to Alternative Service(s):   Place:   Date:   Time:    Referred to Alternative Service(s):   Place:   Date:   Time:    Referred to Alternative Service(s):   Place:   Date:   Time:    Referred to Alternative Service(s):   Place:   Date:   Time:     Meryle Ready, Counselor

## 2021-04-14 NOTE — ED Provider Notes (Signed)
Behavioral Health Admission [facility] & OBS)  Date: 04/14/21 Patient Name: Joel EpsteinChristopher J Newton MRN: [id number] Chief Complaint: No chief complaint on file.     Diagnoses:  Final diagnoses:  Opioid dependence with opioid-induced mood disorder (HCC)    HPI:  Joel Newton 30 yr old male patient presented to [facility] as a walk in alone with complaints of "I need help with my heroin/fentanyl addiction". He was assessed and will be admitted to the [facility] for stabilization.   Joel Newton, 30 y.o., male patient seen face to face by this provider, consulted with Dr. Lucianne MussKumar; and chart reviewed on 04/14/21.     During evaluation Joel EpsteinChristopher J Keetch is in sitting position in no acute distress. He makes good eye contact. Speech is clear, coherent, normal rate and tone. He is tearful through out assessment.  He alert/oriented x and cooperative. He is depressed and anxious with congruent affect. His thought process is coherent and relevant; There is no indication that he is currently responding to internal/external stimuli or experiencing delusional thought content. He denies self-harm/homicidal ideation, psychosis, and paranoia.  Endorses suicidal ideation, without a plan, intent or access to means. States, "I have lost everything because of my drug problem, my mom is dead, my wife left me I am homeless, I have nothing".   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 States he doesn't know how much but he does spend $20-200 per day. States it just depends on how much money he can get a hold of. States he is afraid of being sick from stopping heroin and fentanyl. Provided reassurance. He is currently unempoyed.  No outpatient services in place.  Patient is requesting substance abuse treatment.     PHQ 2-9:   Flowsheet Row [facility] (Discharged) [facility]2020 [facility]  C-SSRS RISK CATEGORY Low Risk No Risk No Risk        Total Time spent with patient: 30 minutes  Musculoskeletal  Strength & Muscle Tone: within normal limits Gait & Station: normal Patient leans: N/A  Psychiatric Specialty Exam  Presentation General Appearance: Disheveled  Eye Contact:Good  Speech:Clear and Coherent; Normal Rate  Speech Volume:Normal  Handedness:Right   Mood and Affect  Mood:Anxious; Depressed  Affect:Tearful; Congruent; Depressed   Thought Process  Thought Processes:Coherent  Descriptions of Associations:Intact  Orientation:Full (Time, Place and Person)  Thought Content:Logical    Hallucinations:Hallucinations: None  Ideas of Reference:None  Suicidal Thoughts:Suicidal Thoughts: Yes, Passive SI Passive Intent and/or Plan: Without Intent; Without Means to Carry Out; Without Access to Means  Homicidal Thoughts:Homicidal Thoughts: No   Sensorium  Memory:Recent Good; Remote Good; Immediate Good  Judgment:Poor  Insight:Fair   Executive Functions  Concentration:Good  Attention Span:Good  Recall:Good  Fund of Knowledge:Good  Language:Good   Psychomotor Activity  Psychomotor Activity:Psychomotor Activity: Normal   Assets  Assets:Communication Skills; Desire for Improvement; Financial Resources/Insurance; Physical Health; Resilience; Social Support   Sleep  Sleep:Sleep: Poor Number of Hours of Sleep: 0   No data recorded  Physical Exam Vitals and nursing note reviewed.  Constitutional:      Appearance: He is well-developed.  HENT:     Head: Normocephalic and atraumatic.  Eyes:     General:        Right eye: No discharge.        Left eye: No discharge.  Conjunctiva/sclera: Conjunctivae normal.  Cardiovascular:     Rate and Rhythm: Normal rate.     Heart sounds: No murmur heard. Pulmonary:     Effort: Pulmonary effort is normal. No respiratory distress.  Musculoskeletal:         General: Normal range of motion.     Cervical back: Normal range of motion.  Skin:    Coloration: Skin is not jaundiced or pale.  Neurological:     Mental Status: He is alert and oriented to person, place, and time.  Psychiatric:        Attention and Perception: Attention and perception normal.        Mood and Affect: Mood is anxious and depressed. Affect is tearful.        Speech: Speech normal.        Behavior: Behavior normal. Behavior is cooperative.        Thought Content: Thought content includes suicidal ideation. Thought content does not include suicidal plan.        Cognition and Memory: Cognition normal.        Judgment: Judgment is impulsive.   Review of Systems  Constitutional: Negative.  Negative for fever.  HENT: Negative.  Negative for hearing loss.   Eyes: Negative.   Respiratory: Negative.  Negative for cough.   Cardiovascular: Negative.  Negative for chest pain.  Musculoskeletal: Negative.   Skin: Negative.   Neurological: Negative.   Psychiatric/Behavioral:  Positive for depression, substance abuse and suicidal ideas. The patient is nervous/anxious.    Blood pressure 108/66, pulse (!) 54, temperature 99.2 F (37.3 C), temperature source Oral, resp. rate 16, SpO2 97 %. There is no height or weight on file to calculate BMI.  Past Psychiatric History: patient reports- bipolar, depression, PTSD, substance abuse  Is the patient at risk to self? Yes  Has the patient been a risk to self in the past 6 months? Yes .    Has the patient been a risk to self within the distant past? No   Is the patient a risk to others? No   Has the patient been a risk to others in the past 6 months? No   Has the patient been a risk to others within the distant past? No   Past Medical History: No past medical history on file.  Past Surgical History:  Procedure Laterality Date   arm surgery     LEG SURGERY      Family History: No family history on file.  Social History:  Social  History   Socioeconomic History   Marital status: Single    Spouse name: Not on file   Number of children: Not on file   Years of education: Not on file   Highest education level: Not on file  Occupational History   Not on file  Tobacco Use   Smoking status: Every Day    Types: Cigarettes   Smokeless tobacco: Never  Vaping Use   Vaping Use: Never used  Substance and Sexual Activity   Alcohol use: No   Drug use: Yes    Types: Marijuana   Sexual activity: Not on file  Other Topics Concern   Not on file  Social History Narrative   Not on file   Social Determinants of Health   Financial Resource Strain: Not on file  Food Insecurity: Not on file  Transportation Needs: Not on file  Physical Activity: Not on file  Stress: Not on file  Social Connections: Not on file  Intimate Partner Violence: Not on file    SDOH:  SDOH Screenings   Alcohol Screen: Not on file  Depression (PHQ2-9): Not on file  Financial Resource Strain: Not on file  Food Insecurity: Not on file  Housing: Not on file  Physical Activity: Not on file  Social Connections: Not on file  Stress: Not on file  Tobacco Use: High Risk   Smoking Tobacco Use: Every Day   Smokeless Tobacco Use: Never  Transportation Needs: Not on file    Last Labs:  Admission on 04/14/2021  Component Date Value Ref Range Status   SARS Coronavirus 2 by RT PCR 04/14/2021 NEGATIVE  NEGATIVE Final   Comment: (NOTE) SARS-CoV-2 target nucleic acids are NOT DETECTED.  The SARS-CoV-2 RNA is generally detectable in upper respiratory specimens during the acute phase of infection. The lowest concentration of SARS-CoV-2 viral copies this assay can detect is 138 copies/mL. A negative result does not preclude SARS-Cov-2 infection and should not be used as the sole basis for treatment or other patient management decisions. A negative result may occur with  improper specimen collection/handling, submission of specimen other than  nasopharyngeal swab, presence of viral mutation(s) within the areas targeted by this assay, and inadequate number of viral copies(<138 copies/mL). A negative result must be combined with clinical observations, patient history, and epidemiological information. The expected result is Negative.  Fact Sheet for Patients:  BloggerCourse.com  Fact Sheet for Healthcare Providers:  SeriousBroker.it  This test is no                          t yet approved or cleared by the [place] FDA and  has been authorized for detection and/or diagnosis of SARS-CoV-2 by FDA under an Emergency Use Authorization (EUA). This EUA will remain  in effect (meaning this test can be used) for the duration of the COVID-19 declaration under Section 564(b)(1) of the Act, 21 U.S.C.section 360bbb-3(b)(1), unless the authorization is terminated  or revoked sooner.       Influenza A by PCR 04/14/2021 NEGATIVE  NEGATIVE Final   Influenza B by PCR 04/14/2021 NEGATIVE  NEGATIVE Final   Comment: (NOTE) The Xpert Xpress SARS-CoV-2/FLU/RSV plus assay is intended as an aid in the diagnosis of influenza from Nasopharyngeal swab specimens and should not be used as a sole basis for treatment. Nasal washings and aspirates are unacceptable for Xpert Xpress SARS-CoV-2/FLU/RSV testing.  Fact Sheet for Patients: BloggerCourse.com  Fact Sheet for Healthcare Providers: SeriousBroker.it  This test is not yet approved or cleared by the [place] FDA and has been authorized for detection and/or diagnosis of SARS-CoV-2 by FDA under an Emergency Use Authorization (EUA). This EUA will remain in effect (meaning this test can be used) for the duration of the COVID-19 declaration under Section 564(b)(1) of the Act, 21 U.S.C. section 360bbb-3(b)(1), unless the authorization is terminated or revoked.  Performed at [facility], [address] [place], [place] [address]    WBC 04/14/2021 7.1  4.0 - 10.5 K/uL Final   RBC 04/14/2021 4.53  4.22 - 5.81 MIL/uL Final   Hemoglobin 04/14/2021 14.4  13.0 - 17.0 g/dL Final   HCT 64/40/3474 41.8  39.0 - 52.0 % Final   MCV 04/14/2021 92.3  80.0 - 100.0 fL Final   MCH 04/14/2021 31.8  26.0 - 34.0 pg Final   MCHC 04/14/2021 34.4  30.0 - 36.0 g/dL Final   RDW 25/95/6387 11.9  11.5 -  15.5 % Final   Platelets 04/14/2021 311  150 - 400 K/uL Final   nRBC 04/14/2021 0.0  0.0 - 0.2 % Final   Neutrophils Relative % 04/14/2021 71  % Final   Neutro Abs 04/14/2021 5.0  1.7 - 7.7 K/uL Final   Lymphocytes Relative 04/14/2021 18  % Final   Lymphs Abs 04/14/2021 1.3  0.7 - 4.0 K/uL Final   Monocytes Relative 04/14/2021 11  % Final   Monocytes Absolute 04/14/2021 0.8  0.1 - 1.0 K/uL Final   Eosinophils Relative 04/14/2021 0  % Final   Eosinophils Absolute 04/14/2021 0.0  0.0 - 0.5 K/uL Final   Basophils Relative 04/14/2021 0  % Final   Basophils Absolute 04/14/2021 0.0  0.0 - 0.1 K/uL Final   Immature Granulocytes 04/14/2021 0  % Final   Abs Immature Granulocytes 04/14/2021 0.02  0.00 - 0.07 K/uL Final   Performed at [facility], [address] [place], [place] [address]   Sodium 04/14/2021 137  135 - 145 mmol/L Final   Potassium 04/14/2021 4.1  3.5 - 5.1 mmol/L Final   Chloride 04/14/2021 99  98 - 111 mmol/L Final   CO2 04/14/2021 27  22 - 32 mmol/L Final   Glucose, Bld 04/14/2021 100 (A) 70 - 99 mg/dL Final   Glucose reference range applies only to samples taken after fasting for at least 8 hours.   BUN 04/14/2021 14  6 - 20 mg/dL Final   Creatinine, Ser 04/14/2021 0.99  0.61 - 1.24 mg/dL Final   Calcium 19/14/7829 9.9  8.9 - 10.3 mg/dL Final   Total Protein 56/21/3086 7.5  6.5 - 8.1 g/dL Final   Albumin 57/84/6962 4.4  3.5 - 5.0 g/dL Final   AST 95/28/4132 18  15 - 41 U/L Final   ALT 04/14/2021 22  0 - 44 U/L Final   Alkaline Phosphatase 04/14/2021 74  38 - 126  U/L Final   Total Bilirubin 04/14/2021 0.9  0.3 - 1.2 mg/dL Final   GFR, Estimated 04/14/2021 >60  >60 mL/min Final   Comment: (NOTE) Calculated using the CKD-EPI Creatinine Equation (2021)    Anion gap 04/14/2021 11  5 - 15 Final   Performed at [facility], [address] [place], [place] [address]   Hgb A1c MFr Bld 04/14/2021 6.0 (A) 4.8 - 5.6 % Final   Comment: (NOTE) Pre diabetes:          5.7%-6.4%  Diabetes:              >6.4%  Glycemic control for   <7.0% adults with diabetes    Mean Plasma Glucose 04/14/2021 125.5  mg/dL Final   Performed at [facility], [address], [place], [place] [address]   Magnesium 04/14/2021 2.1  1.7 - 2.4 mg/dL Final   Performed at [facility], [address], [place], [place] [address]   Alcohol, Ethyl (B) 04/14/2021 <10  <10 mg/dL Final   Comment: (NOTE) Lowest detectable limit for serum alcohol is 10 mg/dL.  For medical purposes only. Performed at [facility], [address] [place], [place] [address]    TSH 04/14/2021 0.920  0.350 - 4.500 uIU/mL Final   Comment: Performed by a 3rd Generation assay with a functional sensitivity of <=0.01 uIU/mL. Performed at [facility], [address] [place], [place] [address]    Color, Urine 04/14/2021 AMBER (A) YELLOW Final   BIOCHEMICALS MAY BE AFFECTED  BY COLOR   APPearance 04/14/2021 HAZY (A) CLEAR Final   Specific Gravity, Urine 04/14/2021 1.027  1.005 - 1.030 Final   pH 04/14/2021 5.0  5.0 - 8.0 Final   Glucose, UA 04/14/2021 NEGATIVE  NEGATIVE mg/dL Final   Hgb urine dipstick 04/14/2021 NEGATIVE  NEGATIVE Final   Bilirubin Urine 04/14/2021 NEGATIVE  NEGATIVE Final   Ketones, ur 04/14/2021 NEGATIVE  NEGATIVE mg/dL Final   Protein, ur 78/46/9629 NEGATIVE  NEGATIVE mg/dL Final   Nitrite 52/84/1324 NEGATIVE  NEGATIVE Final   Leukocytes,Ua 04/14/2021 NEGATIVE  NEGATIVE Final   RBC / HPF 04/14/2021 0-5  0 - 5 RBC/hpf Final   WBC, UA 04/14/2021 0-5  0 - 5  WBC/hpf Final   Bacteria, UA 04/14/2021 NONE SEEN  NONE SEEN Final   Squamous Epithelial / LPF 04/14/2021 0-5  0 - 5 Final   Mucus 04/14/2021 PRESENT   Final   Ca Oxalate Crys, UA 04/14/2021 PRESENT   Final   Performed at [facility], [address] [place], [place] [address]   POC Amphetamine UR 04/14/2021 None Detected  NONE DETECTED (Cut Off Level 1000 ng/mL) Final   POC Secobarbital (BAR) 04/14/2021 None Detected  NONE DETECTED (Cut Off Level 300 ng/mL) Final   POC Buprenorphine (BUP) 04/14/2021 None Detected  NONE DETECTED (Cut Off Level 10 ng/mL) Final   POC Oxazepam (BZO) 04/14/2021 None Detected  NONE DETECTED (Cut Off Level 300 ng/mL) Final   POC Cocaine UR 04/14/2021 Positive (A) NONE DETECTED (Cut Off Level 300 ng/mL) Final   POC Methamphetamine UR 04/14/2021 None Detected  NONE DETECTED (Cut Off Level 1000 ng/mL) Final   POC Morphine 04/14/2021 None Detected  NONE DETECTED (Cut Off Level 300 ng/mL) Final   POC Oxycodone UR 04/14/2021 None Detected  NONE DETECTED (Cut Off Level 100 ng/mL) Final   POC Methadone UR 04/14/2021 None Detected  NONE DETECTED (Cut Off Level 300 ng/mL) Final   POC Marijuana UR 04/14/2021 Positive (A) NONE DETECTED (Cut Off Level 50 ng/mL) Final   SARS Coronavirus 2 Ag 04/14/2021 Negative  Negative Final   SARSCOV2ONAVIRUS 2 AG 04/14/2021 NEGATIVE  NEGATIVE Final   Comment: (NOTE) SARS-CoV-2 antigen NOT DETECTED.   Negative results are presumptive.  Negative results do not preclude SARS-CoV-2 infection and should not be used as the sole basis for treatment or other patient management decisions, including infection  control decisions, particularly in the presence of clinical signs and  symptoms consistent with COVID-19, or in those who have been in contact with the virus.  Negative results must be combined with clinical observations, patient history, and epidemiological information. The expected result is Negative.  Fact Sheet for Patients:  https://www.jennings-kim.com/  Fact Sheet for Healthcare Providers: https://alexander-rogers.biz/  This test is not yet approved or cleared by the [place] FDA and  has been authorized for detection and/or diagnosis of SARS-CoV-2 by FDA under an Emergency Use Authorization (EUA).  This EUA will remain in effect (meaning this test can be used) for the duration of  the COV                          ID-19 declaration under Section 564(b)(1) of the Act, 21 U.S.C. section 360bbb-3(b)(1), unless the authorization is terminated or revoked sooner.     Cholesterol 04/14/2021 218 (A) 0 - 200 mg/dL Final   Triglycerides 72/53/6644 91  <150 mg/dL Final   HDL 03/47/4259 38 (A) >40 mg/dL Final  Total CHOL/HDL Ratio 04/14/2021 5.7  RATIO Final   VLDL 04/14/2021 18  0 - 40 mg/dL Final   LDL Cholesterol 04/14/2021 162 (A) 0 - 99 mg/dL Final   Comment:        Total Cholesterol/HDL:CHD Risk Coronary Heart Disease Risk Table                     Men   Women  1/2 Average Risk   3.4   3.3  Average Risk       5.0   4.4  2 X Average Risk   9.6   7.1  3 X Average Risk  23.4   11.0        Use the calculated Patient Ratio above and the CHD Risk Table to determine the patient's CHD Risk.        ATP III CLASSIFICATION (LDL):  <100     mg/dL   Optimal  161-096  mg/dL   Near or Above                    Optimal  130-159  mg/dL   Borderline  045-409  mg/dL   High  >811     mg/dL   Very High Performed at [facility], [address], [place], [place] [address]   Admission on 10/30/2020, Discharged on 11/02/2020  Component Date Value Ref Range Status   Lactic Acid, Venous 10/30/2020 2.2 (A) 0.5 - 1.9 mmol/L Final   Comment: CRITICAL RESULT CALLED TO, READ BACK BY AND VERIFIED WITH: FELICIA, RN @ 0303 ON 10/30/20 Riesa Pope Performed at [facility], [address], [place], [place] [address]    Lactic Acid, Venous 10/30/2020 1.2  0.5 - 1.9 mmol/L Final    Performed at [facility], [address], [place], [place] [address]   Sodium 10/30/2020 135  135 - 145 mmol/L Final   Potassium 10/30/2020 3.5  3.5 - 5.1 mmol/L Final   Chloride 10/30/2020 98  98 - 111 mmol/L Final   CO2 10/30/2020 24  22 - 32 mmol/L Final   Glucose, Bld 10/30/2020 119 (A) 70 - 99 mg/dL Final   Glucose reference range applies only to samples taken after fasting for at least 8 hours.   BUN 10/30/2020 20  6 - 20 mg/dL Final   Creatinine, Ser 10/30/2020 1.08  0.61 - 1.24 mg/dL Final   Calcium 57/84/6962 9.8  8.9 - 10.3 mg/dL Final   Total Protein 95/28/4132 8.0  6.5 - 8.1 g/dL Final   Albumin 44/08/270 4.5  3.5 - 5.0 g/dL Final   AST 53/66/4403 23  15 - 41 U/L Final   ALT 10/30/2020 25  0 - 44 U/L Final   Alkaline Phosphatase 10/30/2020 89  38 - 126 U/L Final   Total Bilirubin 10/30/2020 1.1  0.3 - 1.2 mg/dL Final   GFR, Estimated 10/30/2020 >60  >60 mL/min Final   Comment: (NOTE) Calculated using the CKD-EPI Creatinine Equation (2021)    Anion gap 10/30/2020 13  5 - 15 Final   Performed at [facility], [address], [place], [place] [address]   WBC 10/30/2020 9.9  4.0 - 10.5 K/uL Final   RBC 10/30/2020 4.17 (A) 4.22 - 5.81 MIL/uL Final   Hemoglobin 10/30/2020 13.4  13.0 - 17.0 g/dL Final   HCT 95/63/8756 37.7 (A) 39.0 - 52.0 % Final   MCV 10/30/2020 90.4  80.0 - 100.0 fL Final  MCH 10/30/2020 32.1  26.0 - 34.0 pg Final   MCHC 10/30/2020 35.5  30.0 - 36.0 g/dL Final   RDW 16/05/9603 12.2  11.5 - 15.5 % Final   Platelets 10/30/2020 302  150 - 400 K/uL Final   nRBC 10/30/2020 0.0  0.0 - 0.2 % Final   Neutrophils Relative % 10/30/2020 71  % Final   Neutro Abs 10/30/2020 7.1  1.7 - 7.7 K/uL Final   Lymphocytes Relative 10/30/2020 16  % Final   Lymphs Abs 10/30/2020 1.6  0.7 - 4.0 K/uL Final   Monocytes Relative 10/30/2020 10  % Final   Monocytes Absolute 10/30/2020 1.0  0.1 - 1.0 K/uL Final   Eosinophils Relative  10/30/2020 1  % Final   Eosinophils Absolute 10/30/2020 0.1  0.0 - 0.5 K/uL Final   Basophils Relative 10/30/2020 1  % Final   Basophils Absolute 10/30/2020 0.1  0.0 - 0.1 K/uL Final   Immature Granulocytes 10/30/2020 1  % Final   Abs Immature Granulocytes 10/30/2020 0.05  0.00 - 0.07 K/uL Final   Performed at [facility], [address] [place], [place] [address]   Prothrombin Time 10/30/2020 14.0  11.4 - 15.2 seconds Final   INR 10/30/2020 1.1  0.8 - 1.2 Final   Comment: (NOTE) INR goal varies based on device and disease states. Performed at [facility], [address] [place], [place] [address]    aPTT 10/30/2020 29  24 - 36 seconds Final   Performed at [facility], [address] [place], [place] [address]   Specimen Description 10/30/2020    Final                   Value:BLOOD SITE NOT SPECIFIED Performed at [facility], [address], [place], [place] [address]    Special Requests 10/30/2020    Final                   Value:BOTTLES DRAWN AEROBIC AND ANAEROBIC Blood Culture adequate volume Performed at [facility], [address], [place], [place] [address]    Culture 10/30/2020    Final                   Value:NO GROWTH 7 DAYS Performed at [facility], [address] [place], [place] [address]    Report Status 10/30/2020 11/06/2020 FINAL   Final   Color, Urine 10/30/2020 YELLOW  YELLOW Final   APPearance 10/30/2020 CLEAR  CLEAR Final   Specific Gravity, Urine 10/30/2020 1.014  1.005 - 1.030 Final   pH 10/30/2020 8.0  5.0 - 8.0 Final   Glucose, UA 10/30/2020 NEGATIVE  NEGATIVE mg/dL Final   Hgb urine dipstick 10/30/2020 NEGATIVE  NEGATIVE Final   Bilirubin Urine 10/30/2020 NEGATIVE  NEGATIVE Final   Ketones, ur 10/30/2020 20 (A) NEGATIVE mg/dL Final   Protein, ur 95/28/4132 NEGATIVE  NEGATIVE mg/dL Final   Nitrite 44/08/270 NEGATIVE  NEGATIVE Final   Leukocytes,Ua 10/30/2020  NEGATIVE  NEGATIVE Final   RBC / HPF 10/30/2020 0-5  0 - 5 RBC/hpf Final   WBC, UA 10/30/2020 0-5  0 - 5 WBC/hpf Final   Bacteria, UA 10/30/2020 RARE (A) NONE SEEN Final   Squamous Epithelial / LPF 10/30/2020 0-5  0 - 5 Final   Mucus 10/30/2020 PRESENT   Final   Performed at [facility], [address] [place], [place] [address]   Specimen  Description 10/30/2020    Final                   Value:IN/OUT CATH URINE Performed at [facility], [address] [place], [place] [address]    Special Requests 10/30/2020    Final                   Value:NONE Performed at [facility], [address] [place], [place] [address]    Culture 10/30/2020    Final                   Value:NO GROWTH Performed at [facility], [address], [place], [place] [address]    Report Status 10/30/2020 10/31/2020 FINAL   Final   SARS Coronavirus 2 10/30/2020 NEGATIVE  NEGATIVE Final   Comment: (NOTE) SARS-CoV-2 target nucleic acids are NOT DETECTED.  The SARS-CoV-2 RNA is generally detectable in upper and lower respiratory specimens during the acute phase of infection. Negative results do not preclude SARS-CoV-2 infection, do not rule out co-infections with other pathogens, and should not be used as the sole basis for treatment or other patient management decisions. Negative results must be combined with clinical observations, patient history, and epidemiological information. The expected result is Negative.  Fact Sheet for Patients: HairSlick.no  Fact Sheet for Healthcare Providers: quierodirigir.com  This test is not yet approved or cleared by the [place] FDA and  has been authorized for detection and/or diagnosis of SARS-CoV-2 by FDA under an Emergency Use Authorization (EUA). This EUA will remain  in effect (meaning this test can be used) for the duration of the COVID-19  declaration under Se                          ction 564(b)(1) of the Act, 21 U.S.C. section 360bbb-3(b)(1), unless the authorization is terminated or revoked sooner.  Performed at [facility], [address], [place], [place] [address]    Specimen Description 10/30/2020    Final                   Value:BLOOD LEFT ANTECUBITAL Performed at [facility], [address], [place], [place] [address]    Special Requests 10/30/2020    Final                   Value:BOTTLES DRAWN AEROBIC AND ANAEROBIC Blood Culture adequate volume Performed at [facility], [address] [address] [place], [place] [address]    Culture 10/30/2020    Final                   Value:NO GROWTH 7 DAYS Performed at [facility], [address] [place], [place] [address]    Report Status 10/30/2020 11/06/2020 FINAL   Final   Sed Rate 10/30/2020 20 (A) 0 - 16 mm/hr Final   Performed at [facility], [address] [place], [place] [address]   CRP 10/30/2020 2.6 (A) <1.0 mg/dL Final   Performed at [facility], [address] [place], [place] [address]   Opiates 10/30/2020 NONE DETECTED  NONE DETECTED Final   Cocaine 10/30/2020 NONE DETECTED  NONE DETECTED Final   Benzodiazepines 10/30/2020 NONE DETECTED  NONE DETECTED Final   Amphetamines 10/30/2020 POSITIVE (A) NONE DETECTED Final   Tetrahydrocannabinol 10/30/2020 POSITIVE (A) NONE DETECTED Final   Barbiturates  10/30/2020 NONE DETECTED  NONE DETECTED Final   Comment: (NOTE) DRUG SCREEN FOR MEDICAL PURPOSES ONLY.  IF CONFIRMATION IS NEEDED FOR ANY PURPOSE, NOTIFY LAB WITHIN 5 DAYS.  LOWEST DETECTABLE LIMITS FOR URINE DRUG SCREEN Drug Class                     Cutoff (ng/mL) Amphetamine and metabolites    1000 Barbiturate and metabolites    200 Benzodiazepine                 200 Tricyclics and metabolites     300 Opiates and metabolites        300 Cocaine and metabolites         300 THC                            50 Performed at [facility], [address], [place], [place] [address]    Hepatitis B Surface Ag 10/30/2020 NON REACTIVE  NON REACTIVE Final   HCV Ab 10/30/2020 NON REACTIVE  NON REACTIVE Final   Comment: (NOTE) Nonreactive HCV antibody screen is consistent with no HCV infections,  unless recent infection is suspected or other evidence exists to indicate HCV infection.     Hep A IgM 10/30/2020 NON REACTIVE  NON REACTIVE Final   Hep B C IgM 10/30/2020 NON REACTIVE  NON REACTIVE Final   Performed at [facility], [address], [place], [place] [address]   Uric Acid, Serum 10/30/2020 5.8  3.7 - 8.6 mg/dL Final   Performed at [facility], [address], [place], [place] [address]   Hgb A1c MFr Bld 10/30/2020 6.1 (A) 4.8 - 5.6 % Final   Comment: (NOTE) Pre diabetes:          5.7%-6.4%  Diabetes:              >6.4%  Glycemic control for   <7.0% adults with diabetes    Mean Plasma Glucose 10/30/2020 128.37  mg/dL Final   Performed at [facility], [address], [place], [place] [address]    Allergies: Patient has no known allergies.  PTA Medications: (Not in a hospital admission)   [facility] Decision Making  Patient can not contract for safety at this time. He is having suicidal ideations. He is requesting help with opiate addiction. He will be admitted to the [facility].     Recommendations  Based on my evaluation the patient does not appear to have an emergency medical condition.  Patient meets criteria for treatment in the [facility], he will be admitted.  Lab work ordered; CBC wdiff, CMP,TSH ,A1C,  Lipid Panel, Magnesium, ethanol, UDS, U/A  EKG ordered   Medications to start on admission.   Abilify 2 mg PO QD, Gabapentin 100 mg TID  COWS   Ardis Hughs, NP 04/14/21  9:26 PM

## 2021-04-14 NOTE — Progress Notes (Signed)
Patient given sandwich, chips, juice and hot turkey/gravy.  Patient crying throughout meal.  Endorsed SI, but without plan or intent. Denied HI, AVH.  Oriented to unit.

## 2021-04-14 NOTE — ED Provider Notes (Signed)
[facility] Admission Suicide Risk Assessment   Nursing information obtained from:   chart  Demographic factors:   caucasian, low socioeconomic status, homelessness Current Mental Status:   see admission H&P Loss Factors:   loss of marriage, low socioeconomic status, unemployed Historical Factors:   substance abuse, impulsivity  Risk Reduction Factors:   responsibility to family  Total Time spent with patient: 30 minutes Principal Problem: <principal problem not specified> Diagnosis:  Active Problems:   Substance induced mood disorder (HCC)  Subjective Data:   During evaluation Joel Newton is in sitting position in no acute distress. He makes good eye contact. Speech is clear, coherent, normal rate and tone. He is tearful through out assessment.  He alert/oriented x and cooperative. He is depressed and anxious with congruent affect. His thought process is coherent and relevant; There is no indication that he is currently responding to internal/external stimuli or experiencing delusional thought content. He denies self-harm/homicidal ideation, psychosis, and paranoia.  Endorses suicidal ideation, without a plan, intent or access to means. States, "I have lost everything because of my drug problem, my mom is dead, my wife left me I am homeless, I have nothing".    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 States he doesn't know how much but he does spend $20-200 per day. States it just depends on how much money he can get a hold of. States he is afraid of being sick from stopping heroin and fentanyl. Provided reassurance. He is currently unempoyed.   No outpatient services in place.  Patient is requesting substance abuse treatment.   Continued Clinical Symptoms:    The "Alcohol Use Disorders Identification Test", Guidelines for Use in Primary Care, Second Edition.  World Science writer [facility]). Score between 0-7:  no or low risk or alcohol related problems. Score between 8-15:   moderate risk of alcohol related problems. Score between 16-19:  high risk of alcohol related problems. Score 20 or above:  warrants further diagnostic evaluation for alcohol dependence and treatment.   CLINICAL FACTORS:   Severe Anxiety and/or Agitation Bipolar Disorder:   Depressive phase Depression:   Comorbid alcohol abuse/dependence Hopelessness Impulsivity Alcohol/Substance Abuse/Dependencies   Musculoskeletal: Strength & Muscle Tone: within normal limits Gait & Station: normal Patient leans: N/A  Psychiatric Specialty Exam:  Presentation  General Appearance: Disheveled  Eye Contact:Good  Speech:Clear and Coherent; Normal Rate  Speech Volume:Normal  Handedness:Right   Mood and Affect  Mood:Anxious; Depressed  Affect:Tearful; Congruent; Depressed   Thought Process  Thought Processes:Coherent  Descriptions of Associations:Intact  Orientation:Full (Time, Place and Person)  Thought Content:Logical  History of Schizophrenia/Schizoaffective disorder:No  Duration of Psychotic Symptoms:No data recorded Hallucinations:Hallucinations: None  Ideas of Reference:None  Suicidal Thoughts:Suicidal Thoughts: Yes, Passive SI Passive Intent and/or Plan: Without Intent; Without Means to Carry Out; Without Access to Means  Homicidal Thoughts:Homicidal Thoughts: No   Sensorium  Memory:Recent Good; Remote Good; Immediate Good  Judgment:Poor  Insight:Fair   Executive Functions  Concentration:Good  Attention Span:Good  Recall:Good  Fund of Knowledge:Good  Language:Good   Psychomotor Activity  Psychomotor Activity:Psychomotor Activity: Normal   Assets  Assets:Communication Skills; Desire for Improvement; Financial Resources/Insurance; Physical Health; Resilience; Social Support   Sleep  Sleep:Sleep: Poor Number of Hours of Sleep: 0  Physical Exam: Physical Exam see admission H&P   ROSsee admission H&P   Blood pressure 108/66, pulse (!) 54,  temperature 99.2 F (37.3 C), temperature source Oral, resp. rate 16, SpO2 97 %. There is no height or weight on  file to calculate BMI.   COGNITIVE FEATURES THAT CONTRIBUTE TO RISK:  None    SUICIDE RISK:   Mild:  Suicidal ideation of limited frequency, intensity, duration, and specificity.  There are no identifiable plans, no associated intent, mild dysphoria and related symptoms, good self-control (both objective and subjective assessment), few other risk factors, and identifiable protective factors, including available and accessible social support.  PLAN OF CARE:   Based on my evaluation the patient does not appear to have an emergency medical condition.   Patient meets criteria for treatment in the [facility], he will be admitted.   Lab work ordered; CBC wdiff, CMP,TSH ,A1C,  Lipid Panel, Magnesium, ethanol, UDS, U/A   EKG ordered    Medications to start on admission.    Abilify 2 mg PO QD, Gabapentin 100 mg TID   COWS protocol - clonidine  I certify that inpatient services furnished can reasonably be expected to improve the patient's condition.   Ardis Hughs, NP 04/14/2021, 9:28 PM

## 2021-04-14 NOTE — ED Notes (Signed)
Pt escorted to [facility] w/staff. Safety maintained.

## 2021-04-14 NOTE — ED Notes (Signed)
Pt given lunch

## 2021-04-15 DIAGNOSIS — F1124 Opioid dependence with opioid-induced mood disorder: Secondary | ICD-10-CM | POA: Diagnosis not present

## 2021-04-15 DIAGNOSIS — Z20822 Contact with and (suspected) exposure to covid-19: Secondary | ICD-10-CM | POA: Diagnosis not present

## 2021-04-15 MED ORDER — GABAPENTIN 100 MG PO CAPS: 100.0000 mg | ORAL_CAPSULE | Freq: Three times a day (TID) | ORAL | 0 refills | Status: AC

## 2021-04-15 MED ORDER — ARIPIPRAZOLE 2 MG PO TABS: 2.0000 mg | ORAL_TABLET | Freq: Every day | ORAL | 0 refills | Status: AC

## 2021-04-15 MED ORDER — HYDROXYZINE HCL 25 MG PO TABS: 25.0000 mg | ORAL_TABLET | Freq: Four times a day (QID) | ORAL | 0 refills | Status: AC | PRN

## 2021-04-15 NOTE — Progress Notes (Signed)
Pt is awake, alert and oriented. Pt complained of bodily aches. PRN Tylenol and scheduled meds administered with no incident. Pt endorses passive SI. Pt verbally contracts for safety on unit. Pt denies HI/AVH at this time.  Staff will monitor for pt's safety.

## 2021-04-15 NOTE — ED Provider Notes (Signed)
[facility] Discharge Suicide Risk Assessment   Principal Problem: <principal problem not specified> Discharge Diagnoses: Active Problems:   Substance induced mood disorder (HCC)  Subjective:During evaluation Joel Newton is standing and then pacing the room.  He makes fleeting eye contact.  His speech is clear, coherent and pressured and loud.  He is alert and oriented x4.  He is extremely agitated.  Efforts to calm patient were unsuccessful.  He is his mostly cooperative and redirectable.There is no indication that he is currently responding to internal/external stimuli or experiencing delusional thought content.  Patient denies suicidal/self-harm/homicidal ideation.  He denies auditory and visual hallucinations.  He contracts for safety at this time, states "I am not going to freaking hurt myself I just want out of here".  Patient states he was lied to.  States he was told he would get help and be given medications that would help with his detox from heroin/fentanyl.  Discussed with patient that this writer admitted him and absolutely discussed that he would not receive methadone strips or any controlled substance upon his admission.  He states, "I just want to go to [facility] because they will give me methadone strips there".  Explained [facility] currently has a wait list.  Patient was agitated and rude with staff, this Clinical research associate  and demanded to leave.     Stay Summary: Joel Newton was admitted to [facility]  unit for substance abuse treatment, suicidal ideations and crisis management.  He was treated with the following  medications Abilify, gabapentin, and hydroxyzine along with other COWS protocol medications, which were tolerated with no adverse reactions.   Joel Newton was discharged with Abilify 2 mg p.o. daily, gabapentin 100 mg 3 times daily and hydroxyzine 25 mg p.o. 3 times daily as needed, he was instructed on how to take medications as prescribed.    Joel Newton's emotional and  mental status was monitored by staff, overnight he was compliant and showed no unsafe behaviors.  He was offered further treatment options upon discharge including but not limited to Residential, Intensive Outpatient, Outpatient treatment, Rehabilitation services, and resources for shelters and Half-way-house if needed.  He refused.   Upon completion of this admission the Joel Newton was both mentally and medically stable for discharge denying suicidal/homicidal ideation, auditory/visual/tactile hallucinations, delusional thoughts and paranoia.    Total Time spent with patient: 20 minutes  Musculoskeletal: Strength & Muscle Tone: within normal limits Gait & Station: normal Patient leans: N/A  Psychiatric Specialty Exam  Presentation  General Appearance: Disheveled  Eye Contact:Good  Speech:Clear and Coherent; Normal Rate  Speech Volume:Normal  Handedness:Right   Mood and Affect  Mood:Anxious; Irritable  Duration of Depression Symptoms: No data recorded Affect:Congruent   Thought Process  Thought Processes:Coherent  Descriptions of Associations:Intact  Orientation:Full (Time, Place and Person)  Thought Content:Logical  History of Schizophrenia/Schizoaffective disorder:No  Duration of Psychotic Symptoms:No data recorded Hallucinations:Hallucinations: None  Ideas of Reference:None  Suicidal Thoughts:Suicidal Thoughts: No SI Passive Intent and/or Plan: Without Intent; Without Means to Carry Out; Without Access to Means  Homicidal Thoughts:Homicidal Thoughts: No   Sensorium  Memory:Immediate Good; Recent Good; Remote Good  Judgment:Fair  Insight:Fair   Executive Functions  Concentration:Good  Attention Span:Good  Recall:Good  Fund of Knowledge:Good  Language:Good   Psychomotor Activity  Psychomotor Activity:Psychomotor Activity: Normal   Assets  Assets:Communication Skills; Desire for Improvement; Physical Health; Resilience; Social  Support; Vocational/Educational   Sleep  Sleep:Sleep: Fair Number of Hours of Sleep: 0  Physical Exam: Physical Exam see discharge summary ROS see discharge summary Blood pressure 124/68, pulse 65, temperature 98.5 F (36.9 C), temperature source Oral, resp. rate 18, SpO2 97 %. There is no height or weight on file to calculate BMI.  Mental Status Per Nursing Assessment:: Admission H&P 8/09/25/2020 On Admission:   During evaluation Joel Newton is in sitting position in no acute distress. He makes good eye contact. Speech is clear, coherent, normal rate and tone. He is tearful through out assessment.  He alert/oriented x and cooperative. He is depressed and anxious with congruent affect. His thought process is coherent and relevant; There is no indication that he is currently responding to internal/external stimuli or experiencing delusional thought content. He denies self-harm/homicidal ideation, psychosis, and paranoia.  Endorses suicidal ideation, without a plan, intent or access to means. States, "I have lost everything because of my drug problem, my mom is dead, my wife left me I am homeless, I have nothing".    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 States he doesn't know how much but he does spend $20-200 per day. States it just depends on how much money he can get a hold of. States he is afraid of being sick from stopping heroin and fentanyl. Provided reassurance. He is currently unempoyed.   No outpatient services in place.  Patient is requesting substance abuse treatment.   Demographic Factors:  Male, Caucasian, Low socioeconomic status, Living alone, and Unemployed  Loss Factors: Decrease in vocational status and Financial problems/change in socioeconomic status  Historical Factors: Impulsivity  Risk Reduction Factors:   Responsible for children under 79 years of age, Sense of responsibility to family, Positive social support, and Positive therapeutic  relationship  Continued Clinical Symptoms:  Severe Anxiety and/or Agitation Bipolar Disorder:   Depressive phase Depression:   Comorbid alcohol abuse/dependence Impulsivity Alcohol/Substance Abuse/Dependencies  Cognitive Features That Contribute To Risk:  None    Suicide Risk:  Minimal: No identifiable suicidal ideation.  Patients presenting with no risk factors but with morbid ruminations; may be classified as minimal risk based on the severity of the depressive symptoms   Follow-up Information     Services, [facility] Follow up.   Why: Please contact to determine bed availability for residential substance abuse treatment. Contact information: [address] [place] [address] [phone]         [facility] Follow up.   Specialty: Behavioral Health Why: Please go during walk-in hours to establish outpatient medication management and therapy services.   Therapy Walk-in hours: Monday-Wednesday from 8:00am-until slots are open  Medication Management Walk-in hours Monday-Friday from 8:00am-11:00am. Please arrive by 7:30am, as patients are seen first come, first served. Contact information: [address] [place] [address] [phone]                Plan Of Care/Follow-up recommendations:  Activity:  as tolerated Diet:  regular    Discharge patient-patient is demanding to be discharged.   Follow-up with resources provided for outpatient substance abuse resources including [facility] and give [facility] with open access walk-in hours.   Resources provided for homeless shelters  Patient does not meet criteria for involuntary commitment.   Prescription for Abilify 2 mg p.o. daily, gabapentin 100 mg p.o. 3 times daily and hydroxyzine 25 mg p.o. 3 times daily as needed sent to patient's pharmacy.    No evidence of imminent risk to self or others at present.  Patient does not meet  criteria for psychiatric inpatient admission. Discussed crisis plan, support from social network, calling 911, coming to the Emergency Department, and calling Suicide Hotline.     Ardis Hughs, NP 04/15/2021, 10:32 AM

## 2021-04-15 NOTE — Progress Notes (Signed)
Pt's COWS was 12 this AM. Eber Jones, NP notified.

## 2021-04-15 NOTE — ED Notes (Signed)
Pt stated he was having body aches, spasms, and pains due to withdrawal from heroin and fentanyl. Pt was given his PRN medications

## 2021-04-15 NOTE — ED Provider Notes (Signed)
FBC/OBS ASAP Discharge Summary  Date and Time: 04/15/2021 10:32 AM  Name: Joel Newton  MRN:  [id number]   Discharge Diagnoses:  Final diagnoses:  Opioid dependence with opioid-induced mood disorder [facility])    Subjective:  Joel Newton, 30 y.o., male patient who initially presented to [facility] requesting help with heroin/fentanyl addiction and was admitted to the [facility] on 8/28/22022 patient seen face to face by this provider, consulted with treatment team and Dr. Lucianne Muss; and  chart reviewed on 04/15/21.  On evaluation Joel Newton reports, "I was lied to I am ready to be discharged from this facility you guys told me I could leave whenever I wanted and I want to leave now".  During evaluation Joel Newton is standing and then pacing the room.  He makes fleeting eye contact.  His speech is clear, coherent and pressured and loud.  He is alert and oriented x4.  He is extremely agitated.  Efforts to calm patient were unsuccessful.  He is his mostly cooperative and redirectable.There is no indication that he is currently responding to internal/external stimuli or experiencing delusional thought content.  Patient denies suicidal/self-harm/homicidal ideation.  He denies auditory and visual hallucinations.  He contracts for safety at this time, states "I am not going to freaking hurt myself I just want out of here".  Patient states he was lied to.  States he was told he would get help and be given medications that would help with his detox from heroin/fentanyl.  Discussed with patient that this writer admitted him and absolutely discussed that he would not receive methadone strips or any controlled substance upon his admission.  He states, "I just want to go to [facility] because they will give me methadone strips there".  Explained [facility] currently has a wait list.  Patient was agitated and rude with staff, this Clinical research associate  and demanded to leave.   Stay Summary: Joel Newton was  admitted to [facility]  unit for substance abuse treatment, suicidal ideations and crisis management.  He was treated with the following  medications Abilify, gabapentin, and hydroxyzine along with other COWS protocol medications, which were tolerated with no adverse reactions.   Joel Newton was discharged with Abilify 2 mg p.o. daily, gabapentin 100 mg 3 times daily and hydroxyzine 25 mg p.o. 3 times daily as needed, he was instructed on how to take medications as prescribed.   Joel Newton emotional and mental status was monitored by staff, overnight he was compliant and showed no unsafe behaviors.  He was offered further treatment options upon discharge including but not limited to Residential, Intensive Outpatient, Outpatient treatment, Rehabilitation services, and resources for shelters and Half-way-house if needed.  He refused.  Upon completion of this admission the Joel Newton was both mentally and medically stable for discharge denying suicidal/homicidal ideation, auditory/visual/tactile hallucinations, delusional thoughts and paranoia.     Total Time spent with patient: 20 minutes  Past Psychiatric History: patient reports- bipolar, depression, PTSD, substance abuse Past Medical History: No past medical history on file.  Past Surgical History:  Procedure Laterality Date   arm surgery     LEG SURGERY     Family History: No family history on file. Family Psychiatric History: unknown Social History:  Social History   Substance and Sexual Activity  Alcohol Use No     Social History   Substance and Sexual Activity  Drug Use Yes   Types: Marijuana    Social History  Socioeconomic History   Marital status: Single    Spouse name: Not on file   Number of children: Not on file   Years of education: Not on file   Highest education level: Not on file  Occupational History   Not on file  Tobacco Use   Smoking status: Every Day    Types: Cigarettes    Smokeless tobacco: Never  Vaping Use   Vaping Use: Never used  Substance and Sexual Activity   Alcohol use: No   Drug use: Yes    Types: Marijuana   Sexual activity: Not on file  Other Topics Concern   Not on file  Social History Narrative   Not on file   Social Determinants of Health   Financial Resource Strain: Not on file  Food Insecurity: Not on file  Transportation Needs: Not on file  Physical Activity: Not on file  Stress: Not on file  Social Connections: Not on file   SDOH:  SDOH Screenings   Alcohol Screen: Not on file  Depression (PHQ2-9): Not on file  Financial Resource Strain: Not on file  Food Insecurity: Not on file  Housing: Not on file  Physical Activity: Not on file  Social Connections: Not on file  Stress: Not on file  Tobacco Use: High Risk   Smoking Tobacco Use: Every Day   Smokeless Tobacco Use: Never  Transportation Needs: Not on file    Tobacco Cessation:  A prescription for an FDA-approved tobacco cessation medication was offered at discharge and the patient refused  Current Medications:  Current Facility-Administered Medications  Medication Dose Route Frequency Provider Last Rate Last Admin   acetaminophen (TYLENOL) tablet 650 mg  650 mg Oral Q6H PRN Ardis Hughsoleman, Alexanderia Gorby H, NP   650 mg at 04/15/21 0920   alum & mag hydroxide-simeth (MAALOX/MYLANTA) 200-200-20 MG/5ML suspension 30 mL  30 mL Oral Q4H PRN Ardis Hughsoleman, Chalonda Schlatter H, NP       ARIPiprazole (ABILIFY) tablet 2 mg  2 mg Oral Daily Ardis Hughsoleman, Gildo Crisco H, NP   2 mg at 04/15/21 0919   cloNIDine (CATAPRES) tablet 0.1 mg  0.1 mg Oral QID Ardis Hughsoleman, Braylan Faul H, NP   0.1 mg at 04/15/21 0919   Followed by   Melene Muller[START ON 04/17/2021] cloNIDine (CATAPRES) tablet 0.1 mg  0.1 mg Oral BH-qamhs Ardis Hughsoleman, Christyann Manolis H, NP       Followed by   Melene Muller[START ON 04/19/2021] cloNIDine (CATAPRES) tablet 0.1 mg  0.1 mg Oral QAC breakfast Ardis Hughsoleman, Teanna Elem H, NP       dicyclomine (BENTYL) tablet 20 mg  20 mg Oral Q6H PRN Ardis Hughsoleman, Derek Huneycutt H,  NP       gabapentin (NEURONTIN) capsule 100 mg  100 mg Oral TID Ardis Hughsoleman, Vian Fluegel H, NP   100 mg at 04/15/21 0919   hydrOXYzine (ATARAX/VISTARIL) tablet 25 mg  25 mg Oral Q6H PRN Ardis Hughsoleman, Sangeeta Youse H, NP       loperamide (IMODIUM) capsule 2-4 mg  2-4 mg Oral PRN Ardis Hughsoleman, Monaye Blackie H, NP       magnesium hydroxide (MILK OF MAGNESIA) suspension 30 mL  30 mL Oral Daily PRN Ardis Hughsoleman, Trinton Prewitt H, NP       methocarbamol (ROBAXIN) tablet 500 mg  500 mg Oral Q8H PRN Ardis Hughsoleman, Taiwo Fish H, NP   500 mg at 04/15/21 0622   naproxen (NAPROSYN) tablet 500 mg  500 mg Oral BID PRN Ardis Hughsoleman, Reily Treloar H, NP   500 mg at 04/15/21 0622   ondansetron (ZOFRAN-ODT) disintegrating tablet 4 mg  4  mg Oral Q6H PRN Ardis Hughs, NP   4 mg at 04/14/21 2015   Current Outpatient Medications  Medication Sig Dispense Refill   [START ON 04/16/2021] ARIPiprazole (ABILIFY) 2 MG tablet Take 1 tablet (2 mg total) by mouth daily. 30 tablet 0   gabapentin (NEURONTIN) 100 MG capsule Take 1 capsule (100 mg total) by mouth 3 (three) times daily. 90 capsule 0   hydrOXYzine (ATARAX/VISTARIL) 25 MG tablet Take 1 tablet (25 mg total) by mouth every 6 (six) hours as needed for anxiety. 90 tablet 0    PTA Medications: (Not in a hospital admission)   Musculoskeletal  Strength & Muscle Tone: within normal limits Gait & Station: normal Patient leans: N/A  Psychiatric Specialty Exam  Presentation  General Appearance: Disheveled  Eye Contact:Good  Speech:Clear and Coherent; Normal Rate  Speech Volume:Normal  Handedness:Right   Mood and Affect  Mood:Anxious; Irritable  Affect:Congruent   Thought Process  Thought Processes:Coherent  Descriptions of Associations:Intact  Orientation:Full (Time, Place and Person)  Thought Content:Logical  Diagnosis of Schizophrenia or Schizoaffective disorder in past: No    Hallucinations:Hallucinations: None  Ideas of Reference:None  Suicidal Thoughts:Suicidal Thoughts: No SI Passive Intent  and/or Plan: Without Intent; Without Means to Carry Out; Without Access to Means  Homicidal Thoughts:Homicidal Thoughts: No   Sensorium  Memory:Immediate Good; Recent Good; Remote Good  Judgment:Fair  Insight:Fair   Executive Functions  Concentration:Good  Attention Span:Good  Recall:Good  Fund of Knowledge:Good  Language:Good   Psychomotor Activity  Psychomotor Activity:Psychomotor Activity: Normal   Assets  Assets:Communication Skills; Desire for Improvement; Physical Health; Resilience; Social Support; Vocational/Educational   Sleep  Sleep:Sleep: Fair Number of Hours of Sleep: 0   No data recorded  Physical Exam  Physical Exam Vitals and nursing note reviewed.  Constitutional:      General: He is not in acute distress.    Appearance: Normal appearance. He is well-developed. He is not ill-appearing.  HENT:     Head: Normocephalic and atraumatic.  Eyes:     General:        Right eye: No discharge.        Left eye: No discharge.     Conjunctiva/sclera: Conjunctivae normal.  Cardiovascular:     Rate and Rhythm: Normal rate.     Heart sounds: No murmur heard. Pulmonary:     Effort: Pulmonary effort is normal.  Musculoskeletal:        General: Normal range of motion.     Cervical back: Normal range of motion.  Skin:    Coloration: Skin is not jaundiced or pale.  Neurological:     Mental Status: He is alert and oriented to person, place, and time.  Psychiatric:        Attention and Perception: Attention and perception normal.        Mood and Affect: Mood is anxious. Affect is angry.        Speech: Speech is rapid and pressured.        Behavior: Behavior is agitated.        Thought Content: Thought content normal.        Cognition and Memory: Cognition normal.        Judgment: Judgment is impulsive.   Review of Systems  Constitutional: Negative.  Negative for chills and fever.  HENT: Negative.  Negative for hearing loss.   Eyes: Negative.    Respiratory: Negative.  Negative for cough.   Cardiovascular: Negative.  Negative for chest pain.  Musculoskeletal:  Negative.   Skin: Negative.   Neurological: Negative.   Psychiatric/Behavioral:  The patient is nervous/anxious.   Blood pressure 124/68, pulse 65, temperature 98.5 F (36.9 C), temperature source Oral, resp. rate 18, SpO2 97 %. There is no height or weight on file to calculate BMI.  Demographic Factors:  Male, Caucasian, Low socioeconomic status, Living alone, and Unemployed  Loss Factors: Financial problems/change in socioeconomic status  Historical Factors: Impulsivity  Risk Reduction Factors:   Sense of responsibility to family, Positive social support, and Positive therapeutic relationship  Continued Clinical Symptoms:  Severe Anxiety and/or Agitation Bipolar Disorder:   Depressive phase Depression:   Comorbid alcohol abuse/dependence Impulsivity Alcohol/Substance Abuse/Dependencies  Cognitive Features That Contribute To Risk:  None    Suicide Risk:  Minimal: No identifiable suicidal ideation.  Patients presenting with no risk factors but with morbid ruminations; may be classified as minimal risk based on the severity of the depressive symptoms  Plan Of Care/Follow-up recommendations:  Activity:  as tolerated  Diet:  regular   Disposition:   Discharge patient-patient is demanding to be discharged.  Follow-up with resources provided for outpatient substance abuse resources including [facility] and give [facility] with open access walk-in hours.  Resources provided for homeless shelters  Prescription for Abilify 2 mg p.o. daily, gabapentin 100 mg p.o. 3 times daily and hydroxyzine 25 mg p.o. 3 times daily as needed sent to patient's pharmacy.   No evidence of imminent risk to self or others at present.    Patient does not meet criteria for psychiatric inpatient admission. Discussed crisis plan, support from social network, calling  911, coming to the Emergency Department, and calling Suicide Hotline.   Ardis Hughs, NP 04/15/2021, 10:32 AM

## 2021-04-15 NOTE — Discharge Instructions (Addendum)

## 2021-04-15 NOTE — Discharge Summary (Signed)
[name] to be D/C'd Home per NP order. Discussed with the patient and all questions fully answered. An After Visit Summary was printed and given to the patient. Patient escorted out and D/C home via private auto.  Joel Newton  04/15/2021 10:45 AM

## 2021-04-15 NOTE — ED Notes (Signed)
Pt is in room sleeping, respirations even/unlabored, environment check complete/secure, will continue to monitor patient for safety 

## 2022-09-27 IMAGING — MR MR ANKLE*L* WO/W CM
9 series · 40 of 40 positions shown · IV contrast (gadavist)
Comparison: Plain films left foot today.

CLINICAL DATA: Onset left ankle pain, redness and limited range of
motion yesterday. History of IV drug abuse.

EXAM:
MRI OF THE LEFT ANKLE WITHOUT AND WITH CONTRAST
TECHNIQUE: Multiplanar, multisequence MR imaging of the left ankle was
performed both before and after administration of intravenous
contrast.
CONTRAST:  9 mL GADAVIST IV SOLN

[Series 3: T1 · axial · left · 3.0mm · 0.62mm/px · z∈[-3,+141]mm · 4 of 42 slices shown (1 of 2)]
[im 1/42]
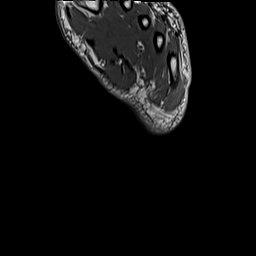
[im 14/42]
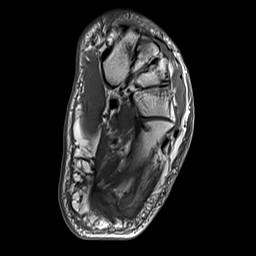
[im 28/42]
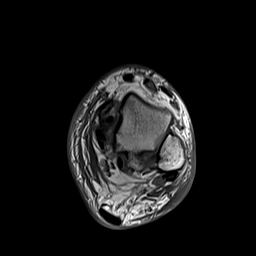
[im 42/42]
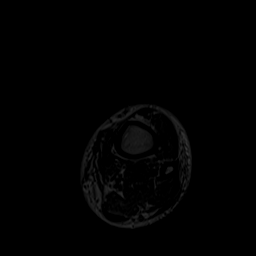

[Series 4: T2 fat-sat · axial · left · 3.0mm · 0.62mm/px · z∈[-3,+141]mm · 5 of 42 slices shown (1 of 2)]
[im 1/42]
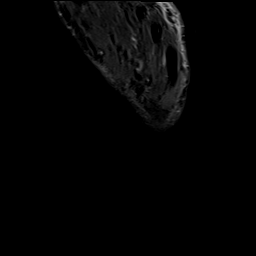
[im 11/42]
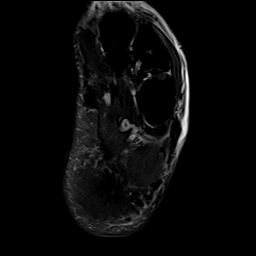
[im 21/42]
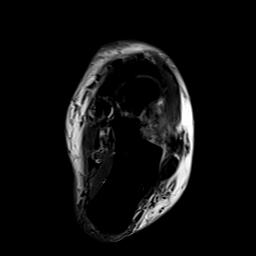
[im 31/42]
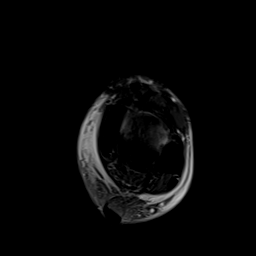
[im 42/42]
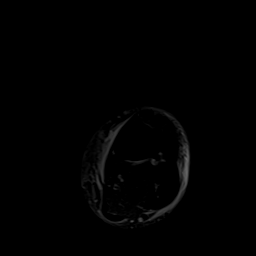

[Series 5: T2 fat-sat · coronal · left · 3.0mm · 0.70mm/px · 5 of 42 slices shown (2 of 2)]
[im 1/42]
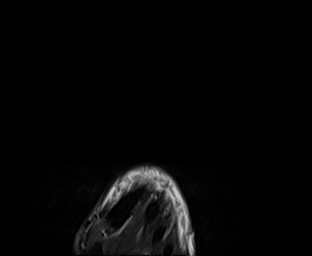
[im 11/42]
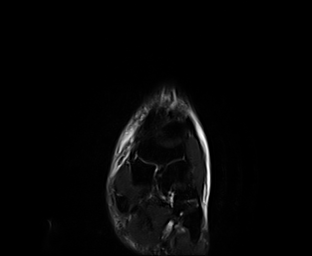
[im 21/42]
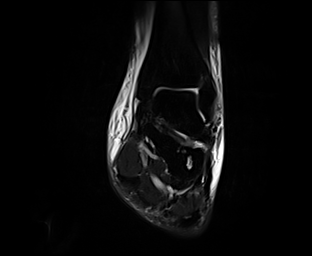
[im 31/42]
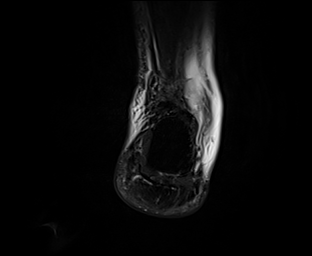
[im 42/42]
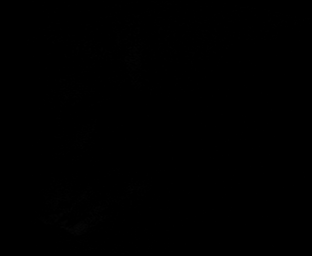

[Series 6: T1 · coronal · left · 3.0mm · 0.70mm/px · 5 of 42 slices shown (2 of 2)]
[im 1/42]
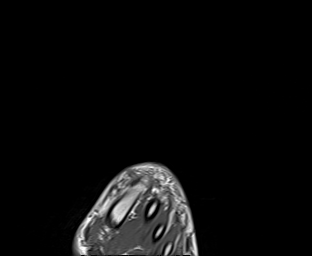
[im 11/42]
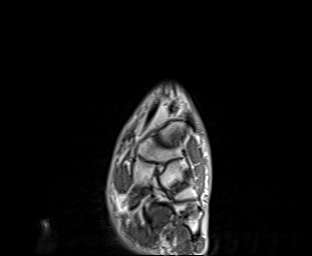
[im 21/42]
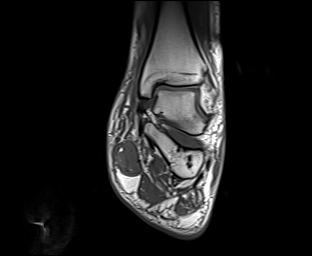
[im 31/42]
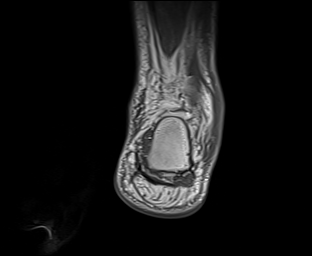
[im 42/42]
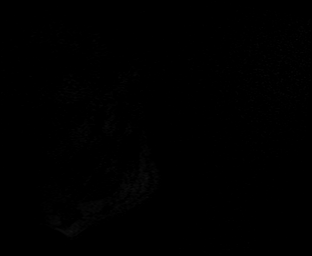

[Series 7: STIR · sagittal · left · 3.0mm · 0.35mm/px · 3 of 27 slices shown]
[im 1/27]
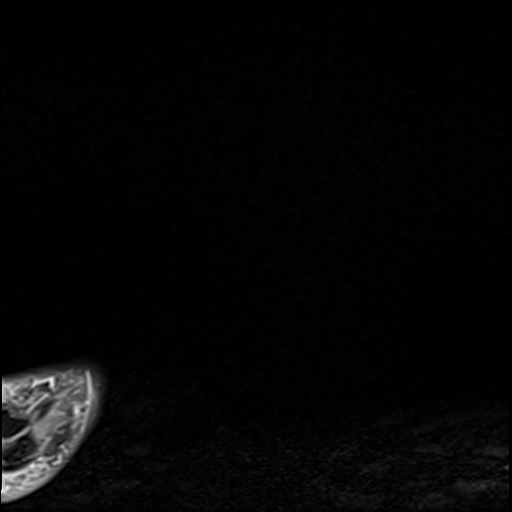
[im 14/27]
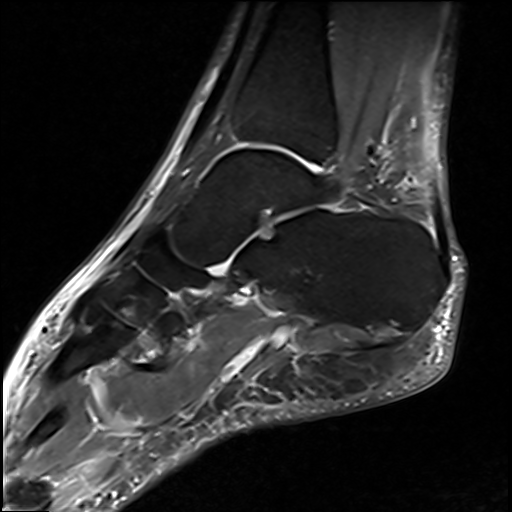
[im 27/27]
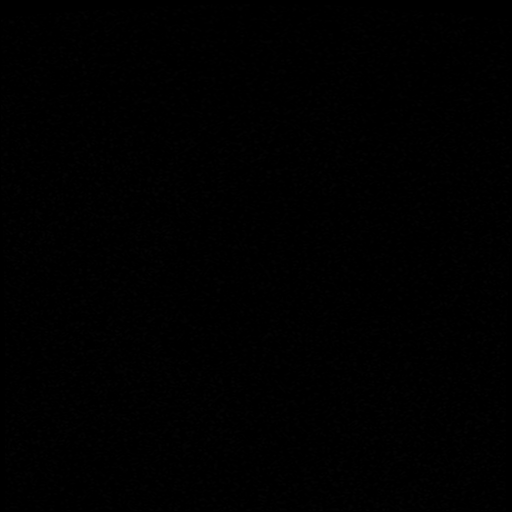

[Series 8: T1 fat-sat · axial · non-contrast · left · 3.0mm · 0.83mm/px · z∈[-3,+141]mm · 5 of 42 slices shown]
[im 1/42]
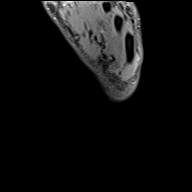
[im 11/42]
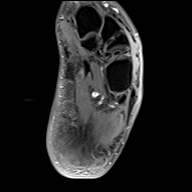
[im 21/42]
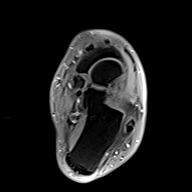
[im 31/42]
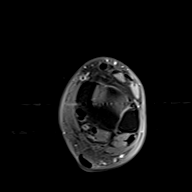
[im 42/42]
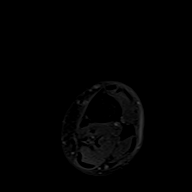

[Series 9: T1 fat-sat post-contrast · axial · left · 3.0mm · 0.83mm/px · z∈[-3,+141]mm · 5 of 42 slices shown (1 of 3)]
[im 1/42]
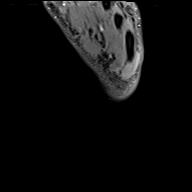
[im 11/42]
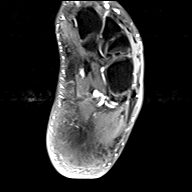
[im 21/42]
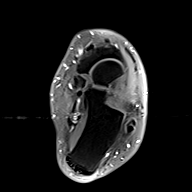
[im 31/42]
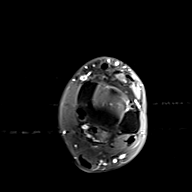
[im 42/42]
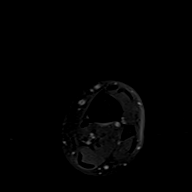

[Series 10: T1 fat-sat post-contrast · sagittal · left · 3.0mm · 0.47mm/px · 3 of 26 slices shown (2 of 3)]
[im 1/26]
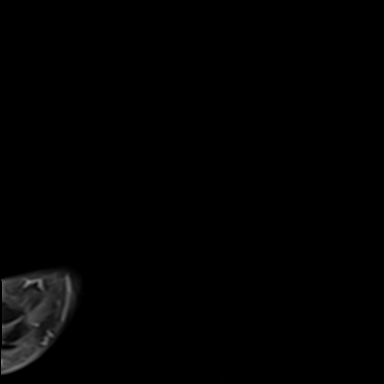
[im 13/26]
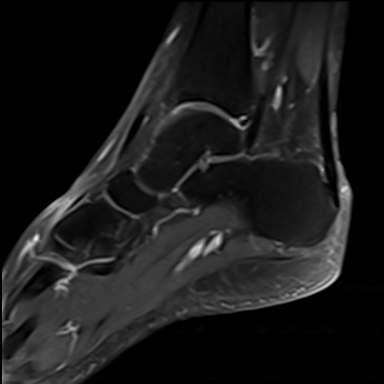
[im 26/26]
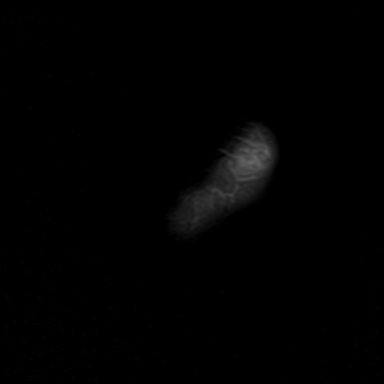

[Series 11: T1 fat-sat post-contrast · coronal · left · 3.0mm · 0.70mm/px · 5 of 42 slices shown (3 of 3)]
[im 1/42]
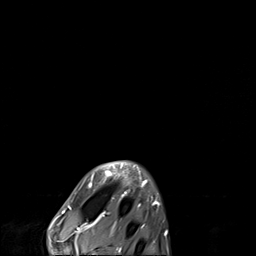
[im 11/42]
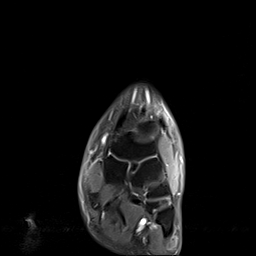
[im 21/42]
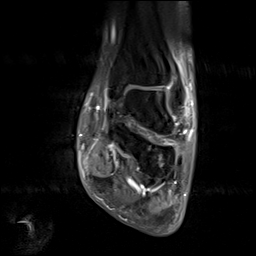
[im 31/42]
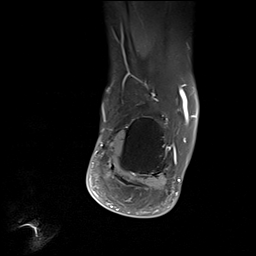
[im 42/42]
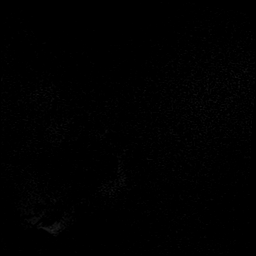

[40 of 40 positions shown; findings below may reference images not displayed]

FINDINGS: Bones/Joint/Cartilage

There is no marrow edema or enhancement to suggest osteomyelitis. No
fracture, stress change or worrisome lesion is identified. No joint
effusion.

Ligaments

Intact.

Muscles and Tendons

Intact and normal in appearance.  No tear evidence tenosynovitis.

Soft tissues

There is subcutaneous edema and enhancement about the ankle
consistent with cellulitis. Negative for abscess.
IMPRESSION: Findings compatible with cellulitis about the ankle. Negative for
osteomyelitis, septic joint or myositis.
